# Patient Record
Sex: Female | Born: 1960 | Hispanic: No | Marital: Married | Smoking: Never smoker
Health system: Southern US, Community
[De-identification: ages and names within clinical notes are randomized; demographics above are authoritative.]

## PROBLEM LIST (undated history)

## (undated) DIAGNOSIS — F329 Major depressive disorder, single episode, unspecified: Secondary | ICD-10-CM

## (undated) DIAGNOSIS — K219 Gastro-esophageal reflux disease without esophagitis: Secondary | ICD-10-CM

## (undated) DIAGNOSIS — T7840XA Allergy, unspecified, initial encounter: Secondary | ICD-10-CM

## (undated) DIAGNOSIS — E7800 Pure hypercholesterolemia, unspecified: Secondary | ICD-10-CM

## (undated) DIAGNOSIS — F32A Depression, unspecified: Secondary | ICD-10-CM

## (undated) HISTORY — DX: Depression, unspecified: F32.A

## (undated) HISTORY — DX: Pure hypercholesterolemia, unspecified: E78.00

## (undated) HISTORY — DX: Major depressive disorder, single episode, unspecified: F32.9

## (undated) HISTORY — DX: Allergy, unspecified, initial encounter: T78.40XA

---

## 2008-02-13 HISTORY — PX: CHOLECYSTECTOMY: SHX55

## 2012-02-13 DIAGNOSIS — R42 Dizziness and giddiness: Secondary | ICD-10-CM

## 2012-02-13 HISTORY — DX: Dizziness and giddiness: R42

## 2013-12-03 DIAGNOSIS — Z0000 Encounter for general adult medical examination without abnormal findings: Secondary | ICD-10-CM | POA: Insufficient documentation

## 2013-12-16 DIAGNOSIS — E559 Vitamin D deficiency, unspecified: Secondary | ICD-10-CM | POA: Insufficient documentation

## 2013-12-29 ENCOUNTER — Ambulatory Visit: Payer: 59

## 2013-12-30 ENCOUNTER — Ambulatory Visit (INDEPENDENT_AMBULATORY_CARE_PROVIDER_SITE_OTHER): Payer: 59

## 2013-12-30 VITALS — BP 127/80 | HR 92 | Resp 12

## 2013-12-30 DIAGNOSIS — M2011 Hallux valgus (acquired), right foot: Secondary | ICD-10-CM

## 2013-12-30 DIAGNOSIS — M779 Enthesopathy, unspecified: Secondary | ICD-10-CM

## 2013-12-30 DIAGNOSIS — M7751 Other enthesopathy of right foot: Secondary | ICD-10-CM

## 2013-12-30 DIAGNOSIS — M79671 Pain in right foot: Secondary | ICD-10-CM

## 2013-12-30 DIAGNOSIS — M778 Other enthesopathies, not elsewhere classified: Secondary | ICD-10-CM

## 2013-12-30 NOTE — Patient Instructions (Signed)
Pre-Operative Instructions  Congratulations, you have decided to take an important step to improving your quality of life.  You can be assured that the doctors of [facility] will be with you every step of the way.  1. Plan to be at the surgery center/hospital at least 1 (one) hour prior to your scheduled time unless otherwise directed by the surgical center/hospital staff.  You must have a responsible adult accompany you, remain during the surgery and drive you home.  Make sure you have directions to the surgical center/hospital and know how to get there on time. 2. For hospital based surgery you will need to obtain a history and physical form from your family physician within 1 month prior to the date of surgery- we will give you a form for you primary physician.  3. We make every effort to accommodate the date you request for surgery.  There are however, times where surgery dates or times have to be moved.  We will contact you as soon as possible if a change in schedule is required.   4. No Aspirin/Ibuprofen for one week before surgery.  If you are on aspirin, any non-steroidal anti-inflammatory medications (Mobic, Aleve, Ibuprofen) you should stop taking it 7 days prior to your surgery.  You make take Tylenol  For pain prior to surgery.  5. Medications- If you are taking daily heart and blood pressure medications, seizure, reflux, allergy, asthma, anxiety, pain or diabetes medications, make sure the surgery center/hospital is aware before the day of surgery so they may notify you which medications to take or avoid the day of surgery. 6. No food or drink after midnight the night before surgery unless directed otherwise by surgical center/hospital staff. 7. No alcoholic beverages 24 hours prior to surgery.  No smoking 24 hours prior to or 24 hours after surgery. 8. Wear loose pants or shorts- loose enough to fit over bandages, boots, and casts. 9. No slip on shoes, sneakers are best. 10. Bring  your boot with you to the surgery center/hospital.  Also bring crutches or a walker if your physician has prescribed it for you.  If you do not have this equipment, it will be provided for you after surgery. 11. If you have not been contracted by the surgery center/hospital by the day before your surgery, call to confirm the date and time of your surgery. 12. Leave-time from work may vary depending on the type of surgery you have.  Appropriate arrangements should be made prior to surgery with your employer. 13. Prescriptions will be provided immediately following surgery by your doctor.  Have these filled as soon as possible after surgery and take the medication as directed. 14. Remove nail polish on the operative foot. 15. Wash the night before surgery.  The night before surgery wash the foot and leg well with the antibacterial soap provided and water paying special attention to beneath the toenails and in between the toes.  Rinse thoroughly with water and dry well with a towel.  Perform this wash unless told not to do so by your physician.  Enclosed: 1 Ice pack (please put in freezer the night before surgery)   1 Hibiclens skin cleaner   Pre-op Instructions  If you have any questions regarding the instructions, do not hesitate to call our office.  [place]: [address] [place], [place] [address] [phone]  [place]: [address] , [place] [address] [phone]  [place]: [address]  [place], [place] [address] [phone]  Dr. Devon Pretty   Tuchman DPM, Dr. Norman Regal DPM Dr. Derenda Giddings DPM, Dr. M. Todd Hyatt DPM, Dr. Kathryn Egerton DPM 

## 2013-12-30 NOTE — Progress Notes (Signed)
   Subjective:    Patient ID: Tonette BihariSharise L Feldstein, female    DOB: 11-17-1960, 53 y.o.   MRN: [id number]  HPI  PT STATED RT FOOT BUNION BEEN PAINFUL FOR 8 MONTHS. FOOT IS GETTING WORSE AND GET AGGRAVATED BY WEARING CERTAIN SHOES. TRIED NO TREATMENT.  Review of Systems  All other systems reviewed and are negative.      Objective:   Physical Exam 53 year old white female well-developed well-nourished oriented 3 presents at this time on referral from Dr. Elijah Birkom for painful bunion deformity right great toe joint. Her left foot is also having some issues although not as severe as is being managed by Dr. Elijah Birkom . Currently getting steroid injections also has had plantar fascial symptoms treated with a cam boot on her left foot recently. Lower extremity objective findings as follows vascular status is intact with pedal pulses palpable DP +2 over 4 bilateral PT +2 over 4 bilateral capillary refill time 3 seconds all digits. Epicritic and proprioceptive sensations intact and symmetric bilateral there is normal plantar response DTRs not elicited dermatologically skin color pigment normal hair growth absent nails unremarkable normal orthopedic biomechanical exam reveals notable bunion deformity bilateral lateral deviation of both hallux right more significant left with erythema and edema the first MTP area right with associated capsulitis. X-rays on the right foot demonstrate IM angle greater than 12-13 hallux abductus angle greater than 30 35 sesamoid position there is slight adductovarus rotation lesser digits although they're flexible nonrigid. No cyst or tumors or fractures are otherwise identified rear foot is relatively rectus intact posterior middle facet and subtalar joint cyma line unremarkable. Inferior calcaneal spurring no retrocalcaneal spurring noted. Patient has tried changing shoes pads cushions anti-inflammatories all with little or temporary relief at best and understands that surgery is likely her  best option for correcting bunion at this time in alleviating her symptoms job requires her standing or walking for 8 hour shift as such patient surgery will require her to be out of work for a period of 6 up to 8 weeks before she can return to her work duties and regular activities.       Assessment & Plan:  Assessment hallux abductovalgus deformity right foot action bunion deformities noted bilateral however more severe capsulitis and some small G noted on the right plan at this time x-rays were taken [facility] K wire fixations reviewed and signed all questions asked by the patient are answered we discussed surgical alternatives risks complications reactions to medications etc. She understands she will be in air fracture boot for at least 1 month proxy 5 week duration postoperatively although weightbearing limited activities for the first month. At this time consent form was reviewed and signed and surgery scheduled at her convenience. Patient will continue with care for her contralateral left foot with Dr. Elijah Birkom , however also briefly discussed that the bunion on the left foot may need to be addressed at some point in the future as well however patient was advised that only one to be done at any one time due to the immobilization required.  Alvan Dameichard Thea Holshouser DPM

## 2014-01-22 ENCOUNTER — Telehealth: Payer: Self-pay | Admitting: *Deleted

## 2014-01-22 NOTE — Telephone Encounter (Signed)
"  I need to reschedule my surgery from 02/03/2014 to 02/08/2014."  I informed her that Dr. Ralene CorkSikora doesn't do surgery on Monday.  He works in our [place] office on Monday.  "I will be willing to go there to have it done or do you have any other days available that week?"  I offered her Wednesday, 02/10/2014.  "That is fine, thank you."  I will inform Dr. Ralene CorkSikora.  I called and rescheduled surgery with Aram [facility]amynthia at [facility].

## 2014-01-22 NOTE — Telephone Encounter (Signed)
New post-op appointment is scheduled on Tuesday February 16, 2014 @ 3:15 pm.

## 2014-01-26 ENCOUNTER — Ambulatory Visit (INDEPENDENT_AMBULATORY_CARE_PROVIDER_SITE_OTHER): Payer: 59

## 2014-01-26 ENCOUNTER — Ambulatory Visit: Payer: 59

## 2014-01-26 VITALS — BP 134/82 | HR 99 | Resp 18

## 2014-01-26 DIAGNOSIS — M7732 Calcaneal spur, left foot: Secondary | ICD-10-CM

## 2014-01-26 DIAGNOSIS — M722 Plantar fascial fibromatosis: Secondary | ICD-10-CM

## 2014-01-26 DIAGNOSIS — M79672 Pain in left foot: Secondary | ICD-10-CM

## 2014-01-26 NOTE — Progress Notes (Signed)
   Subjective:    Patient ID: Christy Watson, female    DOB: May 09, 1960, 53 y.o.   MRN: [id number]  HPI I AM HERE TO TALK ABOUT HAVING SURGERY ON MY LEFT FOOT    Review of Systems no new changes or findings, however patient pain is more significant on her left at this time rather than her right bunion and patient is electing to have surgery on her left foot this year rather than the right     Objective:   Physical Exam 53 year old female well-developed well-nourished oriented 3 presents at this time for follow-up and surgical consultation for left foot. Patient had previously been consented to surgery for her bunion on right foot however wishes to delay that as it is not as painful or symptomatic is her current condition Hunter fasciitis left heel patient is been through steroid injections anti-inflammatories stretching ice exercises steroids and anti-inflammatory medication nonsteroidal anti-inflammatory medications all of which provided little or temporary relief at best. Continues to do stretching ice and exercise however on palpation his exquisite pain tenderness in the inferior calcaneal tubercle of the left heel. There is a small inferior calcaneal spur no fractures no cyst or tumors noted significant inflammation of the fascia is identified on x-ray.       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 Patient or stand  she'll also need to maintain orthoses afterwards and continue with preventative measures as a recurrence is always a possibility.  Alvan Dameichard Antwann Preziosi DPM

## 2014-01-26 NOTE — Patient Instructions (Signed)
Pre-Operative Instructions  Congratulations, you have decided to take an important step to improving your quality of life.  You can be assured that the doctors of [facility] will be with you every step of the way.  1. Plan to be at the surgery center/hospital at least 1 (one) hour prior to your scheduled time unless otherwise directed by the surgical center/hospital staff.  You must have a responsible adult accompany you, remain during the surgery and drive you home.  Make sure you have directions to the surgical center/hospital and know how to get there on time. 2. For hospital based surgery you will need to obtain a history and physical form from your family physician within 1 month prior to the date of surgery- we will give you a form for you primary physician.  3. We make every effort to accommodate the date you request for surgery.  There are however, times where surgery dates or times have to be moved.  We will contact you as soon as possible if a change in schedule is required.   4. No Aspirin/Ibuprofen for one week before surgery.  If you are on aspirin, any non-steroidal anti-inflammatory medications (Mobic, Aleve, Ibuprofen) you should stop taking it 7 days prior to your surgery.  You make take Tylenol  For pain prior to surgery.  5. Medications- If you are taking daily heart and blood pressure medications, seizure, reflux, allergy, asthma, anxiety, pain or diabetes medications, make sure the surgery center/hospital is aware before the day of surgery so they may notify you which medications to take or avoid the day of surgery. 6. No food or drink after midnight the night before surgery unless directed otherwise by surgical center/hospital staff. 7. No alcoholic beverages 24 hours prior to surgery.  No smoking 24 hours prior to or 24 hours after surgery. 8. Wear loose pants or shorts- loose enough to fit over bandages, boots, and casts. 9. No slip on shoes, sneakers are best. 10. Bring  your boot with you to the surgery center/hospital.  Also bring crutches or a walker if your physician has prescribed it for you.  If you do not have this equipment, it will be provided for you after surgery. 11. If you have not been contracted by the surgery center/hospital by the day before your surgery, call to confirm the date and time of your surgery. 12. Leave-time from work may vary depending on the type of surgery you have.  Appropriate arrangements should be made prior to surgery with your employer. 13. Prescriptions will be provided immediately following surgery by your doctor.  Have these filled as soon as possible after surgery and take the medication as directed. 14. Remove nail polish on the operative foot. 15. Wash the night before surgery.  The night before surgery wash the foot and leg well with the antibacterial soap provided and water paying special attention to beneath the toenails and in between the toes.  Rinse thoroughly with water and dry well with a towel.  Perform this wash unless told not to do so by your physician.  Enclosed: 1 Ice pack (please put in freezer the night before surgery)   1 Hibiclens skin cleaner   Pre-op Instructions  If you have any questions regarding the instructions, do not hesitate to call our office.  [place]: [address] [place], [place] [address] [phone]  [place]: [address] [place], [place] [address] [phone]  [place]: [address]  [place], [place] [address] [phone]  Dr. Makeda Peeks   Tuchman DPM, Dr. Norman Regal DPM Dr. Rehmat Murtagh DPM, Dr. M. Todd Hyatt DPM, Dr. Kathryn Egerton DPM 

## 2014-02-08 ENCOUNTER — Telehealth: Payer: Self-pay | Admitting: *Deleted

## 2014-02-08 NOTE — Telephone Encounter (Signed)
Calling to verify some medical information for her disability claim and to follow-up to see if you received any disability paperwork from [employer].  If you received the forms, we'd like to know the turn around time.  We'd greatly appreciate it if you would call us back.

## 2014-02-10 DIAGNOSIS — M722 Plantar fascial fibromatosis: Secondary | ICD-10-CM

## 2014-02-16 ENCOUNTER — Ambulatory Visit (INDEPENDENT_AMBULATORY_CARE_PROVIDER_SITE_OTHER): Payer: 59

## 2014-02-16 ENCOUNTER — Ambulatory Visit: Payer: Self-pay

## 2014-02-16 VITALS — BP 111/78 | HR 96 | Resp 12

## 2014-02-16 DIAGNOSIS — Z09 Encounter for follow-up examination after completed treatment for conditions other than malignant neoplasm: Secondary | ICD-10-CM

## 2014-02-16 DIAGNOSIS — M722 Plantar fascial fibromatosis: Secondary | ICD-10-CM

## 2014-02-16 DIAGNOSIS — M2011 Hallux valgus (acquired), right foot: Secondary | ICD-10-CM

## 2014-02-16 NOTE — Progress Notes (Signed)
   Subjective:    Patient ID: Tonette BihariSharise L Gobert, female    DOB: 27-Nov-1960, 54 y.o.   MRN: [id number]  HPI  DOS 02/10/14 EPF.  ''LF FOOT HEEL IS DOING OK BUT STILL HAVE NUMBNESS ON THE TOES.''  Review of Systems no new findings or systemic changes noted     Objective:   Physical Exam Neurovascular status is intact pedal pulses are palpable DP and PT +2 over 4 Refill time 3 seconds patient is having some abnormal sensation to the dorsal plantar digits and forefoot on the left foot although has intact motor function dorsal flexion plantar flexion inversion eversion good grip the toes has some abnormal sensation may be due to postoperative dressing and edema have minimal or no reproducible pain on palpation medial band plantar fascia medial trochanter tubercle incisions well coapted suture tacks removed and compression stockings applied may resume normal bathing and hygiene starting on Saturday this week however will maintain air fracture boot at all times for at least 2 more weeks as instructed.       Assessment & Plan:  Assessment good postop progress may have some mild neuritis or neuralgia effecting the lesser digits or central digits 23 and 4 although on intact function is noted no motor deficits foot otherwise unremarkable mild edema in the incision areas noted consistent with post EPF procedure. Recheck in 2 weeks for follow-up maintain air fracture boot may resume bathing and hygiene starting on Saturday this week.  Alvan Dameichard Alonie Gazzola DPM

## 2014-02-16 NOTE — Patient Instructions (Signed)
ICE INSTRUCTIONS  Apply ice or cold pack to the affected area at least 3 times a day for 10-15 minutes each time.  You should also use ice after prolonged activity or vigorous exercise.  Do not apply ice longer than 20 minutes at one time.  Always keep a cloth between your skin and the ice pack to prevent burns.  Being consistent and following these instructions will help control your symptoms.  We suggest you purchase a gel ice pack because they are reusable and do bit leak.  Some of them are designed to wrap around the area.  Use the method that works best for you.  Here are some other suggestions for icing.   Use a frozen bag of peas or corn-inexpensive and molds well to your body, usually stays frozen for 10 to 20 minutes.  Wet a towel with cold water and squeeze out the excess until it's damp.  Place in a bag in the freezer for 20 minutes. Then remove and use.  Maintain bandage and anklet to this weekend starting on Saturday may resume normal bathing and hygiene and maintain Neosporin and Band-Aid to the incision if needed maintain anklet for compression as well as maintaining the boot for the next 2 more weeks.

## 2014-03-02 ENCOUNTER — Ambulatory Visit (INDEPENDENT_AMBULATORY_CARE_PROVIDER_SITE_OTHER): Payer: 59

## 2014-03-02 VITALS — BP 125/94 | HR 86 | Resp 12

## 2014-03-02 DIAGNOSIS — M722 Plantar fascial fibromatosis: Secondary | ICD-10-CM

## 2014-03-02 DIAGNOSIS — M79672 Pain in left foot: Secondary | ICD-10-CM

## 2014-03-02 DIAGNOSIS — Z09 Encounter for follow-up examination after completed treatment for conditions other than malignant neoplasm: Secondary | ICD-10-CM

## 2014-03-02 NOTE — Patient Instructions (Signed)
ICE INSTRUCTIONS  Apply ice or cold pack to the affected area at least 3 times a day for 10-15 minutes each time.  You should also use ice after prolonged activity or vigorous exercise.  Do not apply ice longer than 20 minutes at one time.  Always keep a cloth between your skin and the ice pack to prevent burns.  Being consistent and following these instructions will help control your symptoms.  We suggest you purchase a gel ice pack because they are reusable and do bit leak.  Some of them are designed to wrap around the area.  Use the method that works best for you.  Here are some other suggestions for icing.   Use a frozen bag of peas or corn-inexpensive and molds well to your body, usually stays frozen for 10 to 20 minutes.  Wet a towel with cold water and squeeze out the excess until it's damp.  Place in a bag in the freezer for 20 minutes. Then remove and use.  Use ice every evening especially after being on her feet there is any aching throbbing or swelling apply ice as needed.

## 2014-03-02 NOTE — Progress Notes (Signed)
   Subjective:    Patient ID: Christy Watson, female    DOB: 04-Feb-1961, 54 y.o.   MRN: [id number]  HPI  DOS 02/10/14 EPF LT FOOT. ''LT FOOT BOTTOM HEEL IS DOING MUCH BETTER BUT BACK SIDE OF THE HEEL IS PAINFUL.''  Review of Systems No new findings or systemic changes noted    Objective:   Physical Exam Neurovascular status is intact pedal pulses are palpable epicritic and proprioceptive sensations intact minimal or no inferior pain slight tenderness in the posterior calcaneal area may be compensatory pain in the Achilles tendon at this time she proxy 3 weeks postop doing well is well coapted no dehiscence no discharge no signs of infection.       Assessment & Plan:  Assessment is good postop progress fungal EPF left foot. Plan at this time patient may resume regular shoes and orthoses discontinue air fracture boot at this time. May return to work activities to restricted duties 40 hours max may stand or walk 15-20 minutes per hour otherwise sitting and desk job recommendations for the next 6 weeks. After 6 week should be worked regular duties. Return in 4 weeks for long-term postop follow-up the interim maintain compression stocking and ice to help keep down swelling and inflammation.  Alvan Dameichard Javin Nong DPM

## 2014-03-30 ENCOUNTER — Ambulatory Visit (INDEPENDENT_AMBULATORY_CARE_PROVIDER_SITE_OTHER): Payer: 59

## 2014-03-30 VITALS — BP 114/96 | HR 100 | Resp 12

## 2014-03-30 DIAGNOSIS — M2011 Hallux valgus (acquired), right foot: Secondary | ICD-10-CM

## 2014-03-30 DIAGNOSIS — Z09 Encounter for follow-up examination after completed treatment for conditions other than malignant neoplasm: Secondary | ICD-10-CM

## 2014-03-30 DIAGNOSIS — M722 Plantar fascial fibromatosis: Secondary | ICD-10-CM

## 2014-03-30 DIAGNOSIS — M79672 Pain in left foot: Secondary | ICD-10-CM

## 2014-03-30 NOTE — Patient Instructions (Signed)
ICE INSTRUCTIONS  Apply ice or cold pack to the affected area at least 3 times a day for 10-15 minutes each time.  You should also use ice after prolonged activity or vigorous exercise.  Do not apply ice longer than 20 minutes at one time.  Always keep a cloth between your skin and the ice pack to prevent burns.  Being consistent and following these instructions will help control your symptoms.  We suggest you purchase a gel ice pack because they are reusable and do bit leak.  Some of them are designed to wrap around the area.  Use the method that works best for you.  Here are some other suggestions for icing.   Use a frozen bag of peas or corn-inexpensive and molds well to your body, usually stays frozen for 10 to 20 minutes.  Wet a towel with cold water and squeeze out the excess until it's damp.  Place in a bag in the freezer for 20 minutes. Then remove and use.  Steadily increase activities and walking levels over the next several months.

## 2014-03-30 NOTE — Progress Notes (Signed)
   Subjective:    Patient ID: Christy BihariSharise L Watson, female    DOB: Apr 24, 1960, 54 y.o.   MRN: [id number]  HPI  DOS 02/10/14 EPF LT FOOT.  ''LT FOOT BOTTOM OF THE HEEL STILL PAINFUL BUT BACK OF THE HEEL IS MUCH BETTER.''  Review of Systems no new findings or systemic changes noted    Objective:   Physical Exam  Patient is about a month and a half postop EPF left foot still some slight tenderness although not as bad is greatly improved no retrocalcaneal pain mild inferior calcaneal pain occasionally some twinging or shooting pain over the dorsum of the foot particular the base of the first metatarsal possibly some irritation to the dorsal nerve. Maintain compression stocking during the day maintaining good stable shoe did make recommendation is for new balance or Shon BatonBrooks a 6 or [place]'s or as well as [employer]  comfort or Birkenstock type shoes. No pain incisions well coapted ecchymosis no edema no discharge or drainage    Assessment & Plan:  Assessment good postop progress for EPF left foot reappointed 2 months for long-term follow-up possible consult for the contralateral right foot bunion at that time. Within 2 weeks should be able return to work activities without major restrictions  Alvan Dameichard Amiliana Foutz DPM

## 2014-06-01 ENCOUNTER — Ambulatory Visit: Payer: 59

## 2014-08-31 DIAGNOSIS — F419 Anxiety disorder, unspecified: Secondary | ICD-10-CM | POA: Insufficient documentation

## 2015-11-21 ENCOUNTER — Encounter: Payer: Self-pay | Admitting: Nurse Practitioner

## 2016-05-30 ENCOUNTER — Encounter: Payer: Self-pay | Admitting: Nurse Practitioner

## 2016-09-03 ENCOUNTER — Encounter: Payer: Self-pay | Admitting: Nurse Practitioner

## 2016-11-16 ENCOUNTER — Encounter: Payer: Self-pay | Admitting: Nurse Practitioner

## 2017-07-15 ENCOUNTER — Other Ambulatory Visit: Payer: Self-pay

## 2017-07-15 ENCOUNTER — Encounter: Payer: Self-pay | Admitting: Obstetrics and Gynecology

## 2017-07-15 ENCOUNTER — Ambulatory Visit (INDEPENDENT_AMBULATORY_CARE_PROVIDER_SITE_OTHER): Payer: 59 | Admitting: Obstetrics and Gynecology

## 2017-07-15 VITALS — BP 120/78 | HR 100 | Ht 66.0 in | Wt 210.0 lb

## 2017-07-15 DIAGNOSIS — Z1339 Encounter for screening examination for other mental health and behavioral disorders: Secondary | ICD-10-CM

## 2017-07-15 DIAGNOSIS — Z1211 Encounter for screening for malignant neoplasm of colon: Secondary | ICD-10-CM

## 2017-07-15 DIAGNOSIS — Z01419 Encounter for gynecological examination (general) (routine) without abnormal findings: Secondary | ICD-10-CM | POA: Diagnosis not present

## 2017-07-15 DIAGNOSIS — Z124 Encounter for screening for malignant neoplasm of cervix: Secondary | ICD-10-CM | POA: Diagnosis not present

## 2017-07-15 DIAGNOSIS — E782 Mixed hyperlipidemia: Secondary | ICD-10-CM | POA: Insufficient documentation

## 2017-07-15 DIAGNOSIS — F320 Major depressive disorder, single episode, mild: Secondary | ICD-10-CM | POA: Insufficient documentation

## 2017-07-15 DIAGNOSIS — I10 Essential (primary) hypertension: Secondary | ICD-10-CM | POA: Insufficient documentation

## 2017-07-15 DIAGNOSIS — Z1331 Encounter for screening for depression: Secondary | ICD-10-CM | POA: Diagnosis not present

## 2017-07-15 NOTE — Patient Instructions (Signed)
For mammogram: [email or web address]  Phone number: Main: [phone]

## 2017-07-15 NOTE — Progress Notes (Signed)
Routine Annual Gynecology Examination   PCP: Patient, No Pcp Per  Chief Complaint  Patient presents with  . Gynecologic Exam    No complaints   History of Present Illness: Patient is a 57 y.o. G2P0011 presents for annual exam. The patient has no complaints today.   Menopausal bleeding: denies  Menopausal symptoms: reports hot flashes, difficulty sleeping, tired a lot.  Breast symptoms: denies, last mammogram was 2 years ago and it was normal.   Last pap smear: 2 years ago.  Result Normal  Last mammogram: 2 years ago.  Result Normal   She has a vulvar cyst.  It has been present for a couple of weeks on her right side. It is not painful.  She has had a couple that have came and went. She is sexually active. It does not bother her during intercourse.    She has had a cough that started last week.  The cough has been productive of green/yellow sputum.  No fevers and chills.  She denies chest pain and trouble breathing.  She is surrounded by other people with illness.    Past Medical History:  Diagnosis Date  . Allergy   . Depression   . Hypercholesterolemia     Past Surgical History:  Procedure Laterality Date  . CHOLECYSTECTOMY  2010    Medications   Medication Sig Start Date End Date Taking? Authorizing Provider  azelastine (ASTELIN) 0.1 % nasal spray Place into both nostrils 2 (two) times daily. Use in each nostril as directed   Yes [provider]  simvastatin (ZOCOR) 20 MG tablet Take 20 mg by mouth daily.   Yes [provider]  venlafaxine XR (EFFEXOR-XR) 150 MG 24 hr capsule  12/29/13  Yes [provider]    Allergies  Allergen Reactions  . Sulfa Antibiotics Hives   Obstetric History: G2P1011, SVD 3, female   Social History   Socioeconomic History  . Marital status: Unknown    Spouse name: Not on file  . Number of children: Not on file  . Years of education: Not on file  . Highest education level: Not on file    Occupational History  . Not on file  Social Needs  . Financial resource strain: Not on file  . Food insecurity:    Worry: Not on file    Inability: Not on file  . Transportation needs:    Medical: Not on file    Non-medical: Not on file  Tobacco Use  . Smoking status: Never Smoker  . Smokeless tobacco: Never Used  Substance and Sexual Activity  . Alcohol use: No    Alcohol/week: 0.0 oz  . Drug use: No  . Sexual activity: Yes    Birth control/protection: None  Lifestyle  . Physical activity:    Days per week: 0 days    Minutes per session: Not on file  . Stress: Not on file  Relationships  . Social connections:    Talks on phone: Not on file    Gets together: Not on file    Attends religious service: Not on file    Active member of club or organization: Not on file    Attends meetings of clubs or organizations: Not on file    Relationship status: Not on file  . Intimate partner violence:    Fear of current or ex partner: Not on file    Emotionally abused: Not on file    Physically abused: Not on file  Forced sexual activity: Not on file  Other Topics Concern  . Not on file  Social History Narrative  . Not on file    Family History  Problem Relation Age of Onset  . Autoimmune disease Mother   . Diabetes Father   . Thyroid disease Sister   . Autoimmune disease Sister   . Hyperlipidemia Brother   . Hypertension Brother   . Thyroid disease Maternal Aunt   . Bone cancer Maternal Grandfather   . Breast cancer Paternal Grandmother [phone]  . Brain cancer Paternal Grandfather [phone]    Review of Systems  Constitutional: Positive for malaise/fatigue. Negative for chills, diaphoresis, fever and weight loss.       Hot flashes  HENT: Positive for sore throat. Negative for congestion, ear discharge, ear pain, hearing loss, nosebleeds, sinus pain and tinnitus.   Eyes: Positive for redness. Negative for blurred vision, double vision, photophobia, pain and discharge.   Respiratory: Positive for cough. Negative for hemoptysis, sputum production, shortness of breath, wheezing and stridor.   Cardiovascular: Negative.   Gastrointestinal: Positive for diarrhea. Negative for abdominal pain, blood in stool, constipation, heartburn, melena, nausea and vomiting.  Genitourinary: Negative.        Difficulty holding urine  Musculoskeletal: Negative.   Skin: Positive for itching. Negative for rash.       Breast tenderness  Neurological: Negative.   Endo/Heme/Allergies: Negative for environmental allergies and polydipsia. Bruises/bleeds easily.  Psychiatric/Behavioral: Positive for depression. Negative for hallucinations, memory loss, substance abuse and suicidal ideas. The patient is not nervous/anxious and does not have insomnia.      Physical Exam Vitals: BP 120/78 (BP Location: Left Arm, Patient Position: Sitting, Cuff Size: Normal)   Pulse 100   Ht 5\' 6"  (1.676 m)   Wt 210 lb (95.3 kg)   BMI 33.89 kg/m   Physical Exam  Constitutional: She is oriented to person, place, and time. She appears well-developed and well-nourished. No distress.  Genitourinary: Uterus normal. Pelvic exam was performed with patient supine. There is no rash, tenderness, lesion or injury on the right labia. There is no rash, tenderness, lesion or injury on the left labia.    No erythema, tenderness or bleeding in the vagina. No signs of injury around the vagina. No vaginal discharge found. Right adnexum does not display mass, does not display tenderness and does not display fullness. Left adnexum does not display mass, does not display tenderness and does not display fullness. Cervix does not exhibit motion tenderness, lesion, discharge or polyp.   Uterus is mobile and anteverted. Uterus is not enlarged, tender or exhibiting a mass.  HENT:  Head: Normocephalic and atraumatic.  Eyes: EOM are normal. No scleral icterus.  Neck: Normal range of motion. Neck supple. No thyromegaly present.   Cardiovascular: Normal rate and regular rhythm. Exam reveals no gallop and no friction rub.  No murmur heard. Pulmonary/Chest: Effort normal and breath sounds normal. No respiratory distress. She has no wheezes. She has no rales. Right breast exhibits no inverted nipple, no mass, no nipple discharge, no skin change and no tenderness. Left breast exhibits no inverted nipple, no mass, no nipple discharge, no skin change and no tenderness.  Abdominal: Soft. Bowel sounds are normal. She exhibits no distension and no mass. There is no tenderness. There is no rebound and no guarding.  Musculoskeletal: Normal range of motion. She exhibits no edema or tenderness.  Lymphadenopathy:    She has no cervical adenopathy.       Right:  No inguinal adenopathy present.       Left: No inguinal adenopathy present.  Neurological: She is alert and oriented to person, place, and time. No cranial nerve deficit.  Skin: Skin is warm and dry. No rash noted. No erythema.  Psychiatric: She has a normal mood and affect. Her behavior is normal. Judgment normal.    Female chaperone present for pelvic and breast  portions of the physical exam  Results: AUDIT Questionnaire (screen for alcoholism): 1 PHQ-9: 5   Assessment and Plan:  57 y.o. G45P0011 female here for routine annual gynecologic examination  Plan: Problem List Items Addressed This Visit    None    Visit Diagnoses    Women's annual routine gynecological examination    -  Primary   Relevant Orders   IGP, Aptima HPV, rfx 16/18,45   Ambulatory referral to Gastroenterology   Screening for depression       Screening for alcoholism       Pap smear for cervical cancer screening       Relevant Orders   IGP, Aptima HPV, rfx 16/18,45   Screen for colon cancer       Relevant Orders   Ambulatory referral to Gastroenterology      Screening: -- Blood pressure screen normal -- Colonoscopy - due - will schedule -- Mammogram - not due -- Weight screening:  obese: discussed management options, including lifestyle, dietary, and exercise. -- Depression screening negative (PHQ-9) -- Nutrition: normal -- cholesterol screening: per PCP -- osteoporosis screening: not due -- tobacco screening: not using -- alcohol screening: AUDIT questionnaire indicates low-risk usage. -- family history of breast cancer screening: done. not at high risk. -- no evidence of domestic violence or intimate partner violence. -- STD screening: gonorrhea/chlamydia NAAT not collected per patient request. -- pap smear collected per ASCCP guidelines -- HPV vaccination series: not eligilbe  Thomasene Mohair, MD 07/15/2017 1:47 PM

## 2017-07-17 ENCOUNTER — Other Ambulatory Visit: Payer: Self-pay

## 2017-07-18 LAB — IGP, APTIMA HPV, RFX 16/18,45
HPV APTIMA: NEGATIVE
PAP Smear Comment: 0

## 2017-07-25 ENCOUNTER — Encounter: Payer: Self-pay | Admitting: Obstetrics and Gynecology

## 2017-08-13 ENCOUNTER — Ambulatory Visit (INDEPENDENT_AMBULATORY_CARE_PROVIDER_SITE_OTHER): Payer: 59 | Admitting: Nurse Practitioner

## 2017-08-13 ENCOUNTER — Encounter: Payer: Self-pay | Admitting: Nurse Practitioner

## 2017-08-13 VITALS — BP 139/75 | HR 90 | Resp 16 | Ht 66.0 in | Wt 208.6 lb

## 2017-08-13 DIAGNOSIS — J309 Allergic rhinitis, unspecified: Secondary | ICD-10-CM | POA: Insufficient documentation

## 2017-08-13 DIAGNOSIS — F419 Anxiety disorder, unspecified: Secondary | ICD-10-CM | POA: Diagnosis not present

## 2017-08-13 DIAGNOSIS — R197 Diarrhea, unspecified: Secondary | ICD-10-CM

## 2017-08-13 DIAGNOSIS — Z7689 Persons encountering health services in other specified circumstances: Secondary | ICD-10-CM | POA: Diagnosis not present

## 2017-08-13 DIAGNOSIS — I10 Essential (primary) hypertension: Secondary | ICD-10-CM

## 2017-08-13 DIAGNOSIS — E785 Hyperlipidemia, unspecified: Secondary | ICD-10-CM

## 2017-08-13 DIAGNOSIS — F320 Major depressive disorder, single episode, mild: Secondary | ICD-10-CM

## 2017-08-13 DIAGNOSIS — J301 Allergic rhinitis due to pollen: Secondary | ICD-10-CM | POA: Diagnosis not present

## 2017-08-13 MED ORDER — DICYCLOMINE HCL 10 MG PO CAPS: 10.0000 mg | ORAL_CAPSULE | Freq: Three times a day (TID) | ORAL | 5 refills | Status: DC

## 2017-08-13 MED ORDER — VENLAFAXINE HCL ER 75 MG PO CP24: 75.0000 mg | ORAL_CAPSULE | Freq: Every day | ORAL | 5 refills | Status: DC

## 2017-08-13 MED ORDER — AZELASTINE HCL 0.1 % NA SOLN: 1.0000 | Freq: Two times a day (BID) | NASAL | 5 refills | Status: DC

## 2017-08-13 MED ORDER — SIMVASTATIN 20 MG PO TABS: 20.0000 mg | ORAL_TABLET | Freq: Every day | ORAL | 5 refills | Status: DC

## 2017-08-13 NOTE — Patient Instructions (Addendum)
Christy BihariSharise L Watson,   Thank you for coming in to clinic today.  1. For diarrhea: - Exclude all dairy for 2 weeks.  You could have lactose intolerance. - Then, you could consider gluten exclusion.   - START 1/4 dose metamucil once daily.  Then up to 1/2 dose once daily. - START dicyclomine 10 mg up to three times daily with meals.  2. Reduce venlafaxine to 75 mg once daily  3. Take simvastatin in morning if needed.  4. You will be due for FASTING BLOOD WORK.  This means you should eat no food or drink after midnight.  Drink only water or coffee without cream/sugar on the morning of your lab visit. - Please go ahead and schedule a "Lab Only" visit in the morning at the clinic for lab draw 3 days prior to your next visit. - Your results will be available about 2-3 days after blood draw.  If you have set up a MyChart account, you can can log in to MyChart online to view your results and a brief explanation. Also, we can discuss your results together at your next office visit if you would like.   Please schedule a follow-up appointment with Wilhelmina McardleLauren Brilee Port, AGNP. Return in about 3 months (around 11/13/2017) for anxiety/depression, diarrhea, cholesterol.  If you have any other questions or concerns, please feel free to call the clinic or send a message through MyChart. You may also schedule an earlier appointment if necessary.  You will receive a survey after today's visit either digitally by e-mail or paper by [employer] mail. Your experiences and feedback matter to us.  Please respond so we know how we are doing as we provide care for you.   Wilhelmina McardleLauren Lyndsey Demos, DNP, AGNP-BC Adult Gerontology Nurse Practitioner [facility]

## 2017-08-13 NOTE — Progress Notes (Signed)
Subjective:    Patient ID: Christy Watson, female    DOB: 12/23/1960, 57 y.o.   MRN: [id number]  Christy Watson is a 57 y.o. female presenting on 08/13/2017 for Establish Care and Depression (refill effexor)   HPI Establish Care New Provider Pt last seen by PCP around January 2019.  Obtain records from [facility] [place], [place].  Depression Patient presents today for continuation of medications for his depression.  She was recently taken off Wellbutrin and put on Effexor.  She notes new side effect of tremor.  Is still having some shaking in left hand.  Notices when holding a cell phone or piece of paper.  Patient is taking venlafaxine 150 mg daily.  Diarrhea Also experiencing diarrhea every day although has had this "for years."  Is getting worse.  Gall bladder removed a long time ago.  Notes more diarrhea with high fat meals, but admits diarrhea occurs even without hypercapnia.  Nocturnal enuresis Nocturnal enuresis new x 1 occurrence 2 months ago. - No alcohol.  Woke up during urination.  Followed by jaw line itching.  Which has now resolved.   Anke pain - bilateral  No new injury.  Is now starting to exercise to loosen them.  Notes no significant problems, but is slightly concerned  Screening for diabetes Patient requests screening for diabetes because she notes that she is very tired with sugar or carbs - Has "crankiness," nausea, shakiness without food (hypoglycemia).  Past Medical History:  Diagnosis Date  . Allergy   . Depression   . Hypercholesterolemia    Past Surgical History:  Procedure Laterality Date  . CHOLECYSTECTOMY  2010   Social History   Socioeconomic History  . Marital status: Unknown    Spouse name: Not on file  . Number of children: Not on file  . Years of education: Not on file  . Highest education level: Not on file  Occupational History  . Not on file  Social Needs  . Financial resource strain: Not on file  . Food  insecurity:    Worry: Not on file    Inability: Not on file  . Transportation needs:    Medical: Not on file    Non-medical: Not on file  Tobacco Use  . Smoking status: Never Smoker  . Smokeless tobacco: Never Used  Substance and Sexual Activity  . Alcohol use: No    Alcohol/week: 0.0 oz  . Drug use: No  . Sexual activity: Yes    Birth control/protection: None  Lifestyle  . Physical activity:    Days per week: 0 days    Minutes per session: Not on file  . Stress: Not on file  Relationships  . Social connections:    Talks on phone: Not on file    Gets together: Not on file    Attends religious service: Not on file    Active member of club or organization: Not on file    Attends meetings of clubs or organizations: Not on file    Relationship status: Not on file  . Intimate partner violence:    Fear of current or ex partner: Not on file    Emotionally abused: Not on file    Physically abused: Not on file    Forced sexual activity: Not on file  Other Topics Concern  . Not on file  Social History Narrative  . Not on file   Family History  Problem Relation Age of Onset  .  Autoimmune disease Mother   . Diabetes Father   . Thyroid disease Sister   . Autoimmune disease Sister   . Hyperlipidemia Brother   . Hypertension Brother   . Thyroid disease Maternal Aunt   . Bone cancer Maternal Grandfather   . Breast cancer Paternal Grandmother [phone]  . Brain cancer Paternal Grandfather [phone]   Current Outpatient Medications on File Prior to Visit  Medication Sig  . azelastine (ASTELIN) 0.1 % nasal spray Place into both nostrils 2 (two) times daily. Use in each nostril as directed  . simvastatin (ZOCOR) 20 MG tablet Take 20 mg by mouth daily.  Marland Kitchen. venlafaxine XR (EFFEXOR-XR) 150 MG 24 hr capsule    No current facility-administered medications on file prior to visit.     Review of Systems  Constitutional: Positive for fatigue. Negative for chills and fever.  HENT: Negative for  congestion and sore throat.   Eyes: Negative for pain.  Respiratory: Negative for cough, shortness of breath and wheezing.   Cardiovascular: Negative for chest pain, palpitations and leg swelling.  Gastrointestinal: Positive for diarrhea. Negative for abdominal pain, blood in stool, constipation, nausea and vomiting.  Endocrine: Negative for polydipsia.  Genitourinary: Positive for difficulty urinating. Negative for dysuria, frequency, hematuria and urgency.  Musculoskeletal: Positive for arthralgias (Bilateral ankles). Negative for back pain, myalgias and neck pain.  Skin: Negative.  Negative for rash.  Allergic/Immunologic: Negative for environmental allergies.  Neurological: Negative for dizziness, weakness and headaches.  Hematological: Does not bruise/bleed easily.  Psychiatric/Behavioral: Positive for dysphoric mood. Negative for suicidal ideas. The patient is not nervous/anxious.    Per HPI unless specifically indicated above     Objective:    BP 139/75   Pulse 90   Resp 16   Ht 5\' 6"  (1.676 m)   Wt 208 lb 9.6 oz (94.6 kg)   BMI 33.67 kg/m   Wt Readings from Last 3 Encounters:  08/13/17 208 lb 9.6 oz (94.6 kg)  07/15/17 210 lb (95.3 kg)    Physical Exam  Constitutional: She is oriented to person, place, and time. She appears well-developed and well-nourished. No distress.  HENT:  Head: Normocephalic and atraumatic.  Cardiovascular: Normal rate, regular rhythm, S1 normal, S2 normal, normal heart sounds and intact distal pulses.  Pulmonary/Chest: Effort normal and breath sounds normal. No respiratory distress.  Abdominal: Soft. She exhibits no distension. Bowel sounds are increased. There is no hepatosplenomegaly. There is no tenderness. No hernia.  Neurological: She is alert and oriented to person, place, and time.  Skin: Skin is warm and dry.  Psychiatric: She has a normal mood and affect. Her behavior is normal.  Vitals reviewed.   Results for orders placed or  performed in visit on 07/15/17  IGP, Aptima HPV, rfx 16/18,45  Result Value Ref Range   DIAGNOSIS: Comment    Specimen adequacy: Comment    Clinician Provided ICD10 Comment    Performed by: Comment    PAP Smear Comment .    Note: Comment    Test Methodology Comment    HPV Aptima Negative Negative      Assessment & Plan:   Problem List Items Addressed This Visit      Cardiovascular and Mediastinum   Benign essential hypertension Stable.  Continue meds without change.  Refills provided.  Labs at next visit.  Follow-up 3 months    Relevant Medications   simvastatin (ZOCOR) 20 MG tablet     Respiratory   Allergic rhinitis Stable.  Continue astelin.  No changes.  May need ENT in future if worsening.  Consider flonase/antihistamine daily for improved control in future if needed.  Follow-up prn.   Relevant Medications   azelastine (ASTELIN) 0.1 % nasal spray     Other   Hyperlipidemia LDL goal <130 Previously stable per patient report.  Continue simvastatin.  Refills provided.  Labs at next visit.  Follow-up 3 months    Relevant Medications   simvastatin (ZOCOR) 20 MG tablet   Mild major depression (HCC) Stable and improving .  Patient can begin to reduce venlafaxine to 75 mg once daily.  Continue non-pharm stress management strategies.  Follow-up 3 months.   Relevant Medications   venlafaxine XR (EFFEXOR-XR) 75 MG 24 hr capsule   Anxiety - Primary See AP depression   Relevant Medications   venlafaxine XR (EFFEXOR-XR) 75 MG 24 hr capsule    Other Visit Diagnoses    Encounter to establish care     Previous PCP Records will be requested.  Past medical, family, and surgical history reviewed w/ pt.     Diarrhea, unspecified type     Uncontrolled and unspecified trigger.  Patient should consider gluten, dairy exclusion may also consider soy/egg exclusion if needing to further id possible triggers.   - START 1/4 dose metamucil once daily.  Then up to 1/2 dose once daily. - START  dicyclomine 10 mg up to three times daily with meals. - Followup 3 months   Relevant Medications   dicyclomine (BENTYL) 10 MG capsule      Meds ordered this encounter  Medications  . simvastatin (ZOCOR) 20 MG tablet    Sig: Take 1 tablet (20 mg total) by mouth daily.    Dispense:  30 tablet    Refill:  5    Order Specific Question:   Supervising Provider    Answer:   Smitty Cords [[id number]]  . venlafaxine XR (EFFEXOR-XR) 75 MG 24 hr capsule    Sig: Take 1 capsule (75 mg total) by mouth daily with breakfast.    Dispense:  30 capsule    Refill:  5    Order Specific Question:   Supervising Provider    Answer:   Smitty Cords [[id number]]  . azelastine (ASTELIN) 0.1 % nasal spray    Sig: Place 1 spray into both nostrils 2 (two) times daily. Use in each nostril as directed    Dispense:  30 mL    Refill:  5    Order Specific Question:   Supervising Provider    Answer:   Smitty Cords [[id number]]  . dicyclomine (BENTYL) 10 MG capsule    Sig: Take 1 capsule (10 mg total) by mouth 4 (four) times daily -  before meals and at bedtime.    Dispense:  90 capsule    Refill:  5    Order Specific Question:   Supervising Provider    Answer:   Smitty Cords [[id number]]     Follow up plan: Return in about 3 months (around 11/13/2017) for anxiety/depression, diarrhea, cholesterol.  Wilhelmina Mcardle, DNP, AGPCNP-BC Adult Gerontology Primary Care Nurse Practitioner [facility] 08/13/2017, 2:58 PM

## 2017-11-11 ENCOUNTER — Other Ambulatory Visit: Payer: 59

## 2017-11-12 ENCOUNTER — Encounter: Payer: Self-pay | Admitting: Nurse Practitioner

## 2017-11-14 ENCOUNTER — Ambulatory Visit: Payer: 59 | Admitting: Nurse Practitioner

## 2017-11-25 ENCOUNTER — Other Ambulatory Visit: Payer: Self-pay

## 2017-11-25 DIAGNOSIS — F320 Major depressive disorder, single episode, mild: Secondary | ICD-10-CM

## 2017-11-25 DIAGNOSIS — F419 Anxiety disorder, unspecified: Secondary | ICD-10-CM

## 2017-11-25 NOTE — Telephone Encounter (Signed)
Patient is requesting that her venlafixine RX 75mg  be sent to [facility] [place] for 90 day supply.

## 2017-11-26 MED ORDER — VENLAFAXINE HCL ER 75 MG PO CP24: 75.0000 mg | ORAL_CAPSULE | Freq: Every day | ORAL | 0 refills | Status: DC

## 2017-11-26 NOTE — Addendum Note (Signed)
Addended by: Lonna Cobb on: 11/26/2017 02:52 PM   Modules accepted: Orders

## 2017-11-26 NOTE — Addendum Note (Signed)
Addended by: Wilhelmina Mcardle R on: 11/26/2017 03:15 PM   Modules accepted: Orders

## 2017-11-26 NOTE — Telephone Encounter (Signed)
The pt was notified that per her last visit she was notified that she needs to f/u in 3 mths. She needs to be seen in the office in order to get a 90 day script, but a 30 day prescription was sent over. She verbal understanding.

## 2017-12-19 ENCOUNTER — Other Ambulatory Visit: Payer: Self-pay | Admitting: Nurse Practitioner

## 2017-12-19 DIAGNOSIS — F320 Major depressive disorder, single episode, mild: Secondary | ICD-10-CM

## 2017-12-19 DIAGNOSIS — F419 Anxiety disorder, unspecified: Secondary | ICD-10-CM

## 2017-12-24 ENCOUNTER — Other Ambulatory Visit: Payer: 59

## 2017-12-24 ENCOUNTER — Other Ambulatory Visit: Payer: Self-pay | Admitting: Nurse Practitioner

## 2017-12-24 DIAGNOSIS — E785 Hyperlipidemia, unspecified: Secondary | ICD-10-CM

## 2017-12-24 LAB — COMPLETE METABOLIC PANEL WITH GFR
AG Ratio: 1.8 (calc) (ref 1.0–2.5)
ALT: 38 U/L — ABNORMAL HIGH (ref 6–29)
AST: 25 U/L (ref 10–35)
Albumin: 4.5 g/dL (ref 3.6–5.1)
Alkaline phosphatase (APISO): 102 U/L (ref 33–130)
BUN: 17 mg/dL (ref 7–25)
CO2: 28 mmol/L (ref 20–32)
Calcium: 9.4 mg/dL (ref 8.6–10.4)
Chloride: 103 mmol/L (ref 98–110)
Creat: 0.62 mg/dL (ref 0.50–1.05)
GFR, Est African American: 118 mL/min/{1.73_m2} (ref >=60)
GFR, Est Non African American: 102 mL/min/{1.73_m2} (ref >=60)
Globulin: 2.5 g/dL (calc) (ref 1.9–3.7)
Glucose, Bld: 84 mg/dL (ref 65–99)
Potassium: 4.5 mmol/L (ref 3.5–5.3)
Sodium: 139 mmol/L (ref 135–146)
Total Bilirubin: 0.6 mg/dL (ref 0.2–1.2)
Total Protein: 7 g/dL (ref 6.1–8.1)

## 2017-12-24 LAB — LIPID PANEL
Cholesterol: 244 mg/dL — ABNORMAL HIGH (ref <200)
HDL: 50 mg/dL — ABNORMAL LOW (ref >50)
LDL Cholesterol (Calc): 160 mg/dL (calc) — ABNORMAL HIGH
Non-HDL Cholesterol (Calc): 194 mg/dL (calc) — ABNORMAL HIGH (ref <130)
Total CHOL/HDL Ratio: 4.9 (calc) (ref <5.0)
Triglycerides: 186 mg/dL — ABNORMAL HIGH (ref <150)

## 2017-12-26 ENCOUNTER — Ambulatory Visit (INDEPENDENT_AMBULATORY_CARE_PROVIDER_SITE_OTHER): Payer: 59 | Admitting: Nurse Practitioner

## 2017-12-26 ENCOUNTER — Encounter: Payer: Self-pay | Admitting: Nurse Practitioner

## 2017-12-26 ENCOUNTER — Other Ambulatory Visit: Payer: Self-pay

## 2017-12-26 VITALS — BP 116/58 | HR 87 | Temp 97.7°F | Ht 66.0 in | Wt 205.4 lb

## 2017-12-26 DIAGNOSIS — E782 Mixed hyperlipidemia: Secondary | ICD-10-CM | POA: Diagnosis not present

## 2017-12-26 DIAGNOSIS — F320 Major depressive disorder, single episode, mild: Secondary | ICD-10-CM | POA: Diagnosis not present

## 2017-12-26 DIAGNOSIS — F419 Anxiety disorder, unspecified: Secondary | ICD-10-CM

## 2017-12-26 DIAGNOSIS — Z23 Encounter for immunization: Secondary | ICD-10-CM

## 2017-12-26 MED ORDER — ROSUVASTATIN CALCIUM 20 MG PO TABS: 20.0000 mg | ORAL_TABLET | Freq: Every day | ORAL | 3 refills | Status: DC

## 2017-12-26 MED ORDER — VENLAFAXINE HCL ER 37.5 MG PO CP24: 37.5000 mg | ORAL_CAPSULE | Freq: Every day | ORAL | 1 refills | Status: DC

## 2017-12-26 MED ORDER — ROSUVASTATIN CALCIUM 20 MG PO TABS: 20.0000 mg | ORAL_TABLET | Freq: Every day | ORAL | 0 refills | Status: DC

## 2017-12-26 MED ORDER — VENLAFAXINE HCL ER 37.5 MG PO CP24: 37.5000 mg | ORAL_CAPSULE | Freq: Every day | ORAL | 0 refills | Status: DC

## 2017-12-26 NOTE — Progress Notes (Signed)
Subjective:    Patient ID: Christy Watson, female    DOB: 03/04/1960, 57 y.o.   MRN: [id number]  Christy Watson is a 57 y.o. female presenting on 12/26/2017 for Anxiety and Hyperlipidemia   HPI Anxiety and depression Is taking venlafaxine only every other day to every 2-3 days.  Has had a period with sadness and becoming easily emotional with easy crying about a month ago that is resolved.  Feels she can decrease dose or consider stopping this med in future.  Hyperlipidemia Patient is only taking her statin 1-2 x per week.  She is not having symptoms, but forgets to take her medication. - Pt denies changes in vision, chest tightness/pressure, palpitations, shortness of breath, leg pain while walking, leg or arm weakness, and sudden loss of speech or loss of consciousness.   GAD 7 : Generalized Anxiety Score 12/26/2017 08/13/2017  Nervous, Anxious, on Edge 0 1  Control/stop worrying 0 0  Worry too much - different things 0 0  Trouble relaxing 1 1  Restless 0 1  Easily annoyed or irritable 0 0  Afraid - awful might happen 0 0  Total GAD 7 Score 1 3  Anxiety Difficulty Not difficult at all Somewhat difficult     Depression screen [facility] 2/9 12/26/2017 08/13/2017 07/15/2017  Decreased Interest 0 0 0  Down, Depressed, Hopeless 0 0 0  PHQ - 2 Score 0 0 0  Altered sleeping 1 3 2   Tired, decreased energy 1 1 1   Change in appetite 0 0 2  Feeling bad or failure about yourself  0 0 0  Trouble concentrating 0 0 0  Moving slowly or fidgety/restless 0 0 0  Suicidal thoughts 0 0 0  PHQ-9 Score 2 4 5   Difficult doing work/chores Somewhat difficult Somewhat difficult Somewhat difficult    Social History   Tobacco Use  . Smoking status: Never Smoker  . Smokeless tobacco: Never Used  Substance Use Topics  . Alcohol use: No    Alcohol/week: 0.0 standard drinks  . Drug use: No    Review of Systems Per HPI unless specifically indicated above     Objective:    BP (!) 116/58 (BP  Location: Right Arm, Patient Position: Sitting, Cuff Size: Normal)   Pulse 87   Temp 97.7 F (36.5 C) (Oral)   Ht 5\' 6"  (1.676 m)   Wt 205 lb 6.4 oz (93.2 kg)   BMI 33.15 kg/m   Wt Readings from Last 3 Encounters:  12/26/17 205 lb 6.4 oz (93.2 kg)  08/13/17 208 lb 9.6 oz (94.6 kg)  07/15/17 210 lb (95.3 kg)    Physical Exam  Constitutional: She is oriented to person, place, and time. She appears well-developed and well-nourished. No distress.  HENT:  Head: Normocephalic and atraumatic.  Neck: Normal range of motion. Neck supple. Carotid bruit is not present.  Cardiovascular: Normal rate, regular rhythm, S1 normal, S2 normal, normal heart sounds and intact distal pulses.  Pulmonary/Chest: Effort normal and breath sounds normal. No respiratory distress.  Musculoskeletal: She exhibits no edema (pedal).  Neurological: She is alert and oriented to person, place, and time.  Skin: Skin is warm and dry.  Psychiatric: She has a normal mood and affect. Her behavior is normal.  Vitals reviewed.    Results for orders placed or performed in visit on 12/24/17  COMPLETE METABOLIC PANEL WITH GFR  Result Value Ref Range   Glucose, Bld 84 65 - 99 mg/dL   BUN 17  7 - 25 mg/dL   Creat 1.610.62 0.960.50 - 0.451.05 mg/dL   GFR, Est Non African American 102 > OR = 60 mL/min/1.5273m2   GFR, Est African American 118 > OR = 60 mL/min/1.4573m2   BUN/Creatinine Ratio NOT APPLICABLE 6 - 22 (calc)   Sodium 139 135 - 146 mmol/L   Potassium 4.5 3.5 - 5.3 mmol/L   Chloride 103 98 - 110 mmol/L   CO2 28 20 - 32 mmol/L   Calcium 9.4 8.6 - 10.4 mg/dL   Total Protein 7.0 6.1 - 8.1 g/dL   Albumin 4.5 3.6 - 5.1 g/dL   Globulin 2.5 1.9 - 3.7 g/dL (calc)   AG Ratio 1.8 1.0 - 2.5 (calc)   Total Bilirubin 0.6 0.2 - 1.2 mg/dL   Alkaline phosphatase (APISO) 102 33 - 130 U/L   AST 25 10 - 35 U/L   ALT 38 (H) 6 - 29 U/L  Lipid panel  Result Value Ref Range   Cholesterol 244 (H) <200 mg/dL   HDL 50 (L) >40>50 mg/dL   Triglycerides  981186 (H) <150 mg/dL   LDL Cholesterol (Calc) 160 (H) mg/dL (calc)   Total CHOL/HDL Ratio 4.9 <5.0 (calc)   Non-HDL Cholesterol (Calc) 194 (H) <130 mg/dL (calc)      Assessment & Plan:   Problem List Items Addressed This Visit      Other   Mixed hyperlipidemia Status unknown.  Recheck labs.  Continue meds with change to daily dosing today.  STOP simvastain.  START rosuvastatin 20 mg once daily.  Refills provided. Followup in 3 mos after labs.    Relevant Medications   rosuvastatin (CRESTOR) 20 MG tablet (Start on 01/23/2018)   rosuvastatin (CRESTOR) 20 MG tablet   Mild major depression (HCC) Improved today on exam.  Medications tolerated without side effects.  Reduce Venlafaxine to 37.5 mg and take daily. Refills provided.   . Followup 3 months. Consider discontinuing med at next visit if remains stable.   Relevant Medications   venlafaxine XR (EFFEXOR XR) 37.5 MG 24 hr capsule (Start on 01/23/2018)   venlafaxine XR (EFFEXOR XR) 37.5 MG 24 hr capsule   Anxiety - Primary See AP depression above.   Relevant Medications   venlafaxine XR (EFFEXOR XR) 37.5 MG 24 hr capsule (Start on 01/23/2018)   venlafaxine XR (EFFEXOR XR) 37.5 MG 24 hr capsule    Other Visit Diagnoses    Needs flu shot     Pt < age 55.  Needs annual influenza vaccine.  Plan: 1. Administer Quad flu vaccine.    Relevant Orders   Flu Vaccine QUAD 6+ mos PF IM (Fluarix Quad PF) (Completed)      Meds ordered this encounter  Medications  . venlafaxine XR (EFFEXOR XR) 37.5 MG 24 hr capsule    Sig: Take 1 capsule (37.5 mg total) by mouth daily with breakfast.    Dispense:  90 capsule    Refill:  1    Order Specific Question:   Supervising Provider    Answer:   Smitty CordsKARAMALEGOS, ALEXANDER J [[id number]]  . rosuvastatin (CRESTOR) 20 MG tablet    Sig: Take 1 tablet (20 mg total) by mouth daily.    Dispense:  90 tablet    Refill:  3    Order Specific Question:   Supervising Provider    Answer:   Smitty CordsKARAMALEGOS, ALEXANDER J [[id number]]    . rosuvastatin (CRESTOR) 20 MG tablet    Sig: Take 1 tablet (20 mg total) by mouth daily.  Dispense:  30 tablet    Refill:  0    Order Specific Question:   Supervising Provider    Answer:   Smitty Cords [[id number]]  . venlafaxine XR (EFFEXOR XR) 37.5 MG 24 hr capsule    Sig: Take 1 capsule (37.5 mg total) by mouth daily with breakfast.    Dispense:  30 capsule    Refill:  0    Order Specific Question:   Supervising Provider    Answer:   Smitty Cords [[id number]]    Follow up plan: Return in about 3 months (around 03/28/2018) for Cholesterol, anxiety.  Wilhelmina Mcardle, DNP, AGPCNP-BC Adult Gerontology Primary Care Nurse Practitioner [facility] 12/26/2017, 8:09 AM

## 2017-12-26 NOTE — Patient Instructions (Addendum)
Christy BihariSharise L Watson,   Thank you for coming in to clinic today.  1. Work on reducing refined sugars and carbohydrates in your diet.   2. CHANGE simvastatin to rosuvastatin 20 mg daily.  3. CHANGE venlafaxine XR 37.5 mg once daily.   Please schedule a follow-up appointment with Wilhelmina McardleLauren Shamarcus Hoheisel, AGNP. Return in about 3 months (around 03/28/2018) for Cholesterol, anxiety.  If you have any other questions or concerns, please feel free to call the clinic or send a message through MyChart. You may also schedule an earlier appointment if necessary.  You will receive a survey after today's visit either digitally by e-mail or paper by [employer] mail. Your experiences and feedback matter to us.  Please respond so we know how we are doing as we provide care for you.   Wilhelmina McardleLauren Karliah Kowalchuk, DNP, AGNP-BC Adult Gerontology Nurse Practitioner [facility], [facility]

## 2018-01-02 ENCOUNTER — Encounter: Payer: Self-pay | Admitting: Nurse Practitioner

## 2018-01-13 ENCOUNTER — Ambulatory Visit: Admission: RE | Admit: 2018-01-13 | Payer: 59 | Source: Ambulatory Visit | Admitting: Gastroenterology

## 2018-01-13 ENCOUNTER — Encounter: Admission: RE | Payer: Self-pay | Source: Ambulatory Visit

## 2018-01-13 SURGERY — COLONOSCOPY WITH PROPOFOL
Anesthesia: Choice

## 2018-01-19 ENCOUNTER — Other Ambulatory Visit: Payer: Self-pay | Admitting: Nurse Practitioner

## 2018-01-19 DIAGNOSIS — E782 Mixed hyperlipidemia: Secondary | ICD-10-CM

## 2018-01-20 MED ORDER — ROSUVASTATIN CALCIUM 20 MG PO TABS: 20.0000 mg | ORAL_TABLET | Freq: Every day | ORAL | 3 refills | Status: DC

## 2018-03-25 ENCOUNTER — Other Ambulatory Visit: Payer: 59

## 2018-03-25 DIAGNOSIS — E782 Mixed hyperlipidemia: Secondary | ICD-10-CM

## 2018-03-26 LAB — LIPID PANEL
Cholesterol: 119 mg/dL (ref <200)
HDL: 48 mg/dL — ABNORMAL LOW (ref >=50)
LDL Cholesterol (Calc): 53 mg/dL (calc)
Non-HDL Cholesterol (Calc): 71 mg/dL (calc) (ref <130)
Total CHOL/HDL Ratio: 2.5 (calc) (ref <5.0)
Triglycerides: 96 mg/dL (ref <150)

## 2018-03-26 LAB — COMPLETE METABOLIC PANEL WITH GFR
AG Ratio: 2.1 (calc) (ref 1.0–2.5)
ALT: 39 U/L — ABNORMAL HIGH (ref 6–29)
AST: 29 U/L (ref 10–35)
Albumin: 4.6 g/dL (ref 3.6–5.1)
Alkaline phosphatase (APISO): 83 U/L (ref 37–153)
BUN: 13 mg/dL (ref 7–25)
CO2: 28 mmol/L (ref 20–32)
Calcium: 9.5 mg/dL (ref 8.6–10.4)
Chloride: 104 mmol/L (ref 98–110)
Creat: 0.69 mg/dL (ref 0.50–1.05)
GFR, Est African American: 114 mL/min/{1.73_m2} (ref >=60)
GFR, Est Non African American: 98 mL/min/{1.73_m2} (ref >=60)
Globulin: 2.2 g/dL (calc) (ref 1.9–3.7)
Glucose, Bld: 93 mg/dL (ref 65–99)
Potassium: 4.6 mmol/L (ref 3.5–5.3)
Sodium: 141 mmol/L (ref 135–146)
Total Bilirubin: 0.5 mg/dL (ref 0.2–1.2)
Total Protein: 6.8 g/dL (ref 6.1–8.1)

## 2018-03-28 ENCOUNTER — Other Ambulatory Visit: Payer: Self-pay

## 2018-03-28 ENCOUNTER — Ambulatory Visit (INDEPENDENT_AMBULATORY_CARE_PROVIDER_SITE_OTHER): Payer: 59 | Admitting: Nurse Practitioner

## 2018-03-28 ENCOUNTER — Encounter: Payer: Self-pay | Admitting: Nurse Practitioner

## 2018-03-28 VITALS — BP 120/64 | HR 75 | Temp 98.4°F | Ht 66.0 in | Wt 197.7 lb

## 2018-03-28 DIAGNOSIS — Z23 Encounter for immunization: Secondary | ICD-10-CM | POA: Diagnosis not present

## 2018-03-28 DIAGNOSIS — E782 Mixed hyperlipidemia: Secondary | ICD-10-CM | POA: Diagnosis not present

## 2018-03-28 DIAGNOSIS — F419 Anxiety disorder, unspecified: Secondary | ICD-10-CM

## 2018-03-28 DIAGNOSIS — F320 Major depressive disorder, single episode, mild: Secondary | ICD-10-CM

## 2018-03-28 DIAGNOSIS — K219 Gastro-esophageal reflux disease without esophagitis: Secondary | ICD-10-CM

## 2018-03-28 DIAGNOSIS — R7401 Elevation of levels of liver transaminase levels: Secondary | ICD-10-CM

## 2018-03-28 DIAGNOSIS — R740 Nonspecific elevation of levels of transaminase and lactic acid dehydrogenase [LDH]: Secondary | ICD-10-CM

## 2018-03-28 MED ORDER — OMEPRAZOLE 20 MG PO CPDR: DELAYED_RELEASE_CAPSULE | ORAL | 1 refills | Status: DC

## 2018-03-28 MED ORDER — VENLAFAXINE HCL ER 37.5 MG PO CP24: 37.5000 mg | ORAL_CAPSULE | Freq: Every day | ORAL | 4 refills | Status: DC

## 2018-03-28 MED ORDER — ROSUVASTATIN CALCIUM 20 MG PO TABS: 20.0000 mg | ORAL_TABLET | Freq: Every day | ORAL | 4 refills | Status: DC

## 2018-03-28 NOTE — Progress Notes (Signed)
Subjective:    Patient ID: Christy BihariSharise L Schamberger, female    DOB: [birth year], 58 y.o.   MRN: [id number]  Christy Watson is a 58 y.o. female presenting on 03/28/2018 for Anxiety and Hyperlipidemia   HPI Hyperlipidemia Patient is taking rosuvastatin 20 mg once daily and tolerates well.  She admits she has had no problems once resuming daily dosing again.  - Patient is exercising 3-4 times per week cardio about 30 minutes. Elliptical usually. - Has had mild chest pain (I think it's just gas and usually in evenings after eating). Otherwise, has no signs and symptoms of ascvd. - Pt denies changes in vision, chest pressure, palpitations, shortness of breath, leg pain while walking, leg or arm weakness, and sudden loss of speech or loss of consciousness.   Anxiety Patient continues on Effextor and notes less stress overall as helping her control her anxiety.  Job is less stressful than any other job in last 5 years. - Feels she is able to "focus on me sometimes."  GAD 7 : Generalized Anxiety Score 03/28/2018 12/26/2017 08/13/2017  Nervous, Anxious, on Edge 0 0 1  Control/stop worrying 0 0 0  Worry too much - different things 0 0 0  Trouble relaxing 1 1 1   Restless 0 0 1  Easily annoyed or irritable 0 0 0  Afraid - awful might happen 0 0 0  Total GAD 7 Score 1 1 3   Anxiety Difficulty Not difficult at all Not difficult at all Somewhat difficult     [facility] 2/9 03/28/2018 12/26/2017 08/13/2017 07/15/2017  Decreased Interest 0 0 0 0  Down, Depressed, Hopeless 0 0 0 0  PHQ - 2 Score 0 0 0 0  Altered sleeping 0 1 3 2   Tired, decreased energy 0 1 1 1   Change in appetite 0 0 0 2  Feeling bad or failure about yourself  2 0 0 0  Trouble concentrating 0 0 0 0  Moving slowly or fidgety/restless 0 0 0 0  Suicidal thoughts 0 0 0 0  PHQ-9 Score 2 2 4 5   Difficult doing work/chores Not difficult at all Somewhat difficult Somewhat difficult Somewhat difficult    Social History   Tobacco  Use  . Smoking status: Never Smoker  . Smokeless tobacco: Never Used  Substance Use Topics  . Alcohol use: No    Alcohol/week: 0.0 standard drinks  . Drug use: No    Review of Systems Per HPI unless specifically indicated above     Objective:    BP 120/64 (BP Location: Right Arm, Patient Position: Sitting, Cuff Size: Normal)   Pulse 75   Temp 98.4 F (36.9 C) (Oral)   Ht 5\' 6"  (1.676 m)   Wt 197 lb 11.2 oz (89.7 kg)   BMI 31.91 kg/m   Wt Readings from Last 3 Encounters:  03/28/18 197 lb 11.2 oz (89.7 kg)  12/26/17 205 lb 6.4 oz (93.2 kg)  08/13/17 208 lb 9.6 oz (94.6 kg)    Physical Exam Vitals signs reviewed.  Constitutional:      General: She is awake. She is not in acute distress.    Appearance: She is well-developed.  HENT:     Head: Normocephalic and atraumatic.  Neck:     Musculoskeletal: Normal range of motion and neck supple.     Vascular: No carotid bruit.  Cardiovascular:     Rate and Rhythm: Normal rate and regular rhythm.     Pulses:  Radial pulses are 2+ on the right side and 2+ on the left side.       Posterior tibial pulses are 1+ on the right side and 1+ on the left side.     Heart sounds: Normal heart sounds, S1 normal and S2 normal.  Pulmonary:     Effort: Pulmonary effort is normal. No respiratory distress.     Breath sounds: Normal breath sounds and air entry.  Abdominal:     General: Bowel sounds are normal. There is no distension.     Palpations: Abdomen is soft.     Tenderness: There is no abdominal tenderness.     Hernia: No hernia is present.  Skin:    General: Skin is warm and dry.     Capillary Refill: Capillary refill takes less than 2 seconds.  Neurological:     General: No focal deficit present.     Mental Status: She is alert and oriented to person, place, and time. Mental status is at baseline.  Psychiatric:        Attention and Perception: Attention normal.        Mood and Affect: Mood and affect normal.         Behavior: Behavior normal. Behavior is cooperative.        Thought Content: Thought content normal.        Judgment: Judgment normal.    Results for orders placed or performed in visit on 03/25/18  Lipid panel  Result Value Ref Range   Cholesterol 119 <200 mg/dL   HDL 48 (L) > OR = 50 mg/dL   Triglycerides 96 <245 mg/dL   LDL Cholesterol (Calc) 53 mg/dL (calc)   Total CHOL/HDL Ratio 2.5 <5.0 (calc)   Non-HDL Cholesterol (Calc) 71 <809 mg/dL (calc)  COMPLETE METABOLIC PANEL WITH GFR  Result Value Ref Range   Glucose, Bld 93 65 - 99 mg/dL   BUN 13 7 - 25 mg/dL   Creat 9.83 3.82 - 5.05 mg/dL   GFR, Est Non African American 98 > OR = 60 mL/min/1.61m2   GFR, Est African American 114 > OR = 60 mL/min/1.41m2   BUN/Creatinine Ratio NOT APPLICABLE 6 - 22 (calc)   Sodium 141 135 - 146 mmol/L   Potassium 4.6 3.5 - 5.3 mmol/L   Chloride 104 98 - 110 mmol/L   CO2 28 20 - 32 mmol/L   Calcium 9.5 8.6 - 10.4 mg/dL   Total Protein 6.8 6.1 - 8.1 g/dL   Albumin 4.6 3.6 - 5.1 g/dL   Globulin 2.2 1.9 - 3.7 g/dL (calc)   AG Ratio 2.1 1.0 - 2.5 (calc)   Total Bilirubin 0.5 0.2 - 1.2 mg/dL   Alkaline phosphatase (APISO) 83 37 - 153 U/L   AST 29 10 - 35 U/L   ALT 39 (H) 6 - 29 U/L      Assessment & Plan:   Problem List Items Addressed This Visit      Other   Mixed hyperlipidemia Previously uncontrolled hyperlipidemia.  Currently taking rosuvastatin 20 mg once daily. No new ASCVD events since last visit, but need to monitor postprandial chest pain with treatment for GERD.   - No personal history of prior ASCVD events  Plan: 1. CONTINUE taking rosuvastatin 20 mg once daily. - Reviewed common side effects of myalgia (reversible off med), less common side effects of cognitive impairment (reversible off med), increased glucose, rhabdomyolysis. 2. Discussed low glycemic diet and reinforced lifestyle changes. 3. Discussed increasing exercise to 30  minutes most days of the week.  Reinforced existing  changes. 4. Labs today 5. Follow-up with labs in about 1 year.    Relevant Medications   rosuvastatin (CRESTOR) 20 MG tablet   Other Relevant Orders   COMPLETE METABOLIC PANEL WITH GFR   Lipid panel   TSH   Mild major depression (HCC)   Anxiety Stable and improved anxiety and depression, both in remission currently on Effexor 37.5 mg once daily.    Plan: 1. Patient is interested in stopping medication since stress levels overall have improved.  - Discussed taking every other day x 2 weeks.  Then take every 3 days for 1 week and stop.  - May resume if needed and continue at 37.5 mg dose 2. Continue exercise and non-pharm stress management strategies. 3. Follow-up 1 year or sooner prn.   Relevant Medications   venlafaxine XR (EFFEXOR XR) 37.5 MG 24 hr capsule    Other Visit Diagnoses    Gastroesophageal reflux disease without esophagitis    -  Primary Patient with chest burning at night after meals when lying flat.  Cannot fully exclude cardiac cause, however timing is most indicative of GERD.  Patient declines cardiac workup.  Plan: 1. Start omeprazole 20 mg once daily for 14 days. Repeat as needed or continue.  If taking daily, follow-up in 6 months. 2. Labs today 3. Monitor for reduction of chest symptoms.  If no improvement, reiterated that patient will need to have cardiac workup completed. 4. Follow-up prn.   Relevant Medications   omeprazole (PRILOSEC) 20 MG capsule   Other Relevant Orders   CBC with Differential/Platelet   Need for shingles vaccine       Relevant Orders   Varicella-zoster vaccine IM (Shingrix) (Completed)   Elevated ALT measurement       Relevant Orders   COMPLETE METABOLIC PANEL WITH GFR      Meds ordered this encounter  Medications  . omeprazole (PRILOSEC) 20 MG capsule    Sig: Take 1 tablet daily for 14 days, then as needed repeat 14 day course.    Dispense:  30 capsule    Refill:  1    Order Specific Question:   Supervising Provider     Answer:   Smitty Cords [[id number]]  . rosuvastatin (CRESTOR) 20 MG tablet    Sig: Take 1 tablet (20 mg total) by mouth daily.    Dispense:  90 tablet    Refill:  4    Order Specific Question:   Supervising Provider    Answer:   Smitty Cords [[id number]]  . venlafaxine XR (EFFEXOR XR) 37.5 MG 24 hr capsule    Sig: Take 1 capsule (37.5 mg total) by mouth daily with breakfast.    Dispense:  90 capsule    Refill:  4    Order Specific Question:   Supervising Provider    Answer:   Smitty Cords [[id number]]    Follow up plan: Return in about 1 year (around 03/29/2019) for Cholesterol, labs 2-3 days prior.  Wilhelmina Mcardle, DNP, AGPCNP-BC Adult Gerontology Primary Care Nurse Practitioner [facility] 03/28/2018, 8:47 AM

## 2018-03-28 NOTE — Patient Instructions (Addendum)
Tonette Bihari,   Thank you for coming in to clinic today.  1. START omeprazole 14 mg one tablet daily for 14 days.  May repeat for 14 days in future as needed.  Also, may continue daily if needed.  Call clinic if taking daily for additional refill and follow-up at 6 months.  2. May consider reducing Effexor to quit.  Take every other day for 2 weeks, then every 3 days for 1 week and stop. - If anxiety returns,  Resume and continue until next visit.  Call for appointment if needed sooner than 1 year.  3. Continue rosuvastatin daily.  This helps keep your heart attack and stroke risk low.  Please schedule a follow-up appointment with Wilhelmina Mcardle, AGNP. Return in about 1 year (around 03/29/2019) for Cholesterol, labs 2-3 days prior.   If you have any other questions or concerns, please feel free to call the clinic or send a message through MyChart. You may also schedule an earlier appointment if necessary.  You will receive a survey after today's visit either digitally by e-mail or paper by [employer]. Your experiences and feedback matter to [place].  Please respond so we know how we are doing as we provide care for you.   Wilhelmina Mcardle, DNP, AGNP-BC Adult Gerontology Nurse Practitioner [facility], [facility]

## 2018-03-31 ENCOUNTER — Encounter: Payer: Self-pay | Admitting: Nurse Practitioner

## 2018-05-28 ENCOUNTER — Ambulatory Visit (INDEPENDENT_AMBULATORY_CARE_PROVIDER_SITE_OTHER): Payer: 59

## 2018-05-28 ENCOUNTER — Other Ambulatory Visit: Payer: Self-pay

## 2018-05-28 DIAGNOSIS — Z23 Encounter for immunization: Secondary | ICD-10-CM | POA: Diagnosis not present

## 2018-07-28 ENCOUNTER — Ambulatory Visit: Payer: 59 | Admitting: Obstetrics and Gynecology

## 2018-07-28 ENCOUNTER — Other Ambulatory Visit: Payer: Self-pay

## 2018-08-11 ENCOUNTER — Other Ambulatory Visit: Payer: Self-pay | Admitting: Obstetrics and Gynecology

## 2018-08-11 DIAGNOSIS — Z1231 Encounter for screening mammogram for malignant neoplasm of breast: Secondary | ICD-10-CM

## 2018-08-20 ENCOUNTER — Ambulatory Visit
Admission: RE | Admit: 2018-08-20 | Discharge: 2018-08-20 | Disposition: A | Discharge status: Home / Self Care | Payer: 59 | Source: Ambulatory Visit | Attending: Obstetrics and Gynecology | Admitting: Obstetrics and Gynecology

## 2018-08-20 ENCOUNTER — Ambulatory Visit: Payer: 59

## 2018-08-20 ENCOUNTER — Other Ambulatory Visit: Payer: Self-pay

## 2018-08-20 DIAGNOSIS — Z1231 Encounter for screening mammogram for malignant neoplasm of breast: Secondary | ICD-10-CM | POA: Diagnosis present

## 2018-08-29 ENCOUNTER — Ambulatory Visit: Payer: 59 | Admitting: Obstetrics and Gynecology

## 2018-09-03 ENCOUNTER — Encounter: Payer: Self-pay | Admitting: Obstetrics and Gynecology

## 2018-09-03 ENCOUNTER — Ambulatory Visit (INDEPENDENT_AMBULATORY_CARE_PROVIDER_SITE_OTHER): Payer: 59 | Admitting: Obstetrics and Gynecology

## 2018-09-03 ENCOUNTER — Other Ambulatory Visit: Payer: Self-pay

## 2018-09-03 VITALS — BP 118/74 | HR 73 | Ht 66.0 in | Wt 174.0 lb

## 2018-09-03 DIAGNOSIS — Z1239 Encounter for other screening for malignant neoplasm of breast: Secondary | ICD-10-CM

## 2018-09-03 DIAGNOSIS — Z01419 Encounter for gynecological examination (general) (routine) without abnormal findings: Secondary | ICD-10-CM | POA: Diagnosis not present

## 2018-09-03 NOTE — Progress Notes (Signed)
Gynecology Annual Exam  PCP: Mikey College, NP  Chief Complaint:  Chief Complaint  Patient presents with  . Gynecologic Exam    History of Present Illness:Patient is a 58 y.o. G2P0011 presents for annual exam. The patient has no complaints today.   LMP: No LMP recorded. Patient is postmenopausal. No postmenopausal bleeding  The patient is sexually active. She denies dyspareunia.  The patient does perform self breast exams.  There is no notable family history of breast or ovarian cancer in her family.  The patient wears seatbelts: yes.   The patient has regular exercise: not asked.    The patient denies current symptoms of depression.     Review of Systems: Review of Systems  Constitutional: Negative for chills and fever.  HENT: Negative for congestion.   Respiratory: Negative for cough and shortness of breath.   Cardiovascular: Negative for chest pain and palpitations.  Gastrointestinal: Negative for abdominal pain, constipation, diarrhea, heartburn, nausea and vomiting.  Genitourinary: Negative for dysuria, frequency and urgency.  Skin: Negative for itching and rash.  Neurological: Negative for dizziness and headaches.  Endo/Heme/Allergies: Negative for polydipsia.  Psychiatric/Behavioral: Negative for depression.    Past Medical History:  Past Medical History:  Diagnosis Date  . Allergy   . Depression   . Hypercholesterolemia     Past Surgical History:  Past Surgical History:  Procedure Laterality Date  . CHOLECYSTECTOMY  2010    Gynecologic History:  No LMP recorded. Patient is postmenopausal. Last Pap: Results were: 07/15/2017 NIL and HR HPV negative  Last mammogram: 08/20/2018 Results were: BI-RAD I  Obstetric History: G2P0011  Family History:  Family History  Problem Relation Age of Onset  . Autoimmune disease Mother   . Diabetes Father   . CVA Father   . Hyperlipidemia Father   . Thyroid disease Sister   . Autoimmune disease Sister   .  Hyperlipidemia Brother   . Hypertension Brother   . Graves' disease Brother   . Thyroid disease Maternal Aunt   . Bone cancer Maternal Grandfather   . Breast cancer Paternal Grandmother 58  . Brain cancer Paternal Grandfather 67    Social History:  Social History   Socioeconomic History  . Marital status: Married    Spouse name: Not on file  . Number of children: Not on file  . Years of education: Not on file  . Highest education level: Not on file  Occupational History  . Not on file  Social Needs  . Financial resource strain: Not on file  . Food insecurity    Worry: Not on file    Inability: Not on file  . Transportation needs    Medical: Not on file    Non-medical: Not on file  Tobacco Use  . Smoking status: Never Smoker  . Smokeless tobacco: Never Used  Substance and Sexual Activity  . Alcohol use: No    Alcohol/week: 0.0 standard drinks  . Drug use: No  . Sexual activity: Not Currently    Birth control/protection: None  Lifestyle  . Physical activity    Days per week: 0 days    Minutes per session: Not on file  . Stress: Not on file  Relationships  . Social Herbalist on phone: Not on file    Gets together: Not on file    Attends religious service: Not on file    Active member of club or organization: Not on file    Attends  meetings of clubs or organizations: Not on file    Relationship status: Not on file  . Intimate partner violence    Fear of current or ex partner: Not on file    Emotionally abused: Not on file    Physically abused: Not on file    Forced sexual activity: Not on file  Other Topics Concern  . Not on file  Social History Narrative  . Not on file    Allergies:  Allergies  Allergen Reactions  . Sulfa Antibiotics Hives    Medications: Prior to Admission medications   Medication Sig Start Date End Date Taking? Authorizing Provider  rosuvastatin (CRESTOR) 20 MG tablet Take 1 tablet (20 mg total) by mouth daily. 03/28/18   Yes Galen ManilaKennedy, Lauren Renee, NP  venlafaxine XR (EFFEXOR XR) 37.5 MG 24 hr capsule Take 1 capsule (37.5 mg total) by mouth daily with breakfast. 03/28/18  Yes Galen ManilaKennedy, Lauren Renee, NP  azelastine (ASTELIN) 0.1 % nasal spray Place 1 spray into both nostrils 2 (two) times daily. Use in each nostril as directed 08/13/17   Galen ManilaKennedy, Lauren Renee, NP  dicyclomine (BENTYL) 10 MG capsule Take 1 capsule (10 mg total) by mouth 4 (four) times daily -  before meals and at bedtime. Patient not taking: Reported on 03/28/2018 08/13/17   Galen ManilaKennedy, Lauren Renee, NP  omeprazole (PRILOSEC) 20 MG capsule Take 1 tablet daily for 14 days, then as needed repeat 14 day course. 03/28/18   Galen ManilaKennedy, Lauren Renee, NP    Physical Exam Vitals: Blood pressure 118/74, pulse 73, height 5\' 6"  (1.676 m), weight 174 lb (78.9 kg).  General: NAD HEENT: normocephalic, anicteric Thyroid: no enlargement, no palpable nodules Pulmonary: No increased work of breathing, CTAB Cardiovascular: RRR, distal pulses 2+ Breast: Breast symmetrical, no tenderness, no palpable nodules or masses, no skin or nipple retraction present, no nipple discharge.  No axillary or supraclavicular lymphadenopathy. Abdomen: NABS, soft, non-tender, non-distended.  Umbilicus without lesions.  No hepatomegaly, splenomegaly or masses palpable. No evidence of hernia  Genitourinary:  External: Normal external female genitalia.  Normal urethral meatus, normal Bartholin's and Skene's glands.    Vagina: Normal vaginal mucosa, no evidence of prolapse.    Cervix: Grossly normal in appearance, no bleeding  Uterus: Non-enlarged, mobile, normal contour.  No CMT  Adnexa: ovaries non-enlarged, no adnexal masses  Rectal: deferred  Lymphatic: no evidence of inguinal lymphadenopathy Extremities: no edema, erythema, or tenderness Neurologic: Grossly intact Psychiatric: mood appropriate, affect full  Female chaperone present for pelvic and breast  portions of the physical exam      Assessment: 58 y.o. G2P0011 routine annual exam  Plan: Problem List Items Addressed This Visit    None    Visit Diagnoses    Encounter for gynecological examination without abnormal finding    -  Primary   Breast screening          1) Mammogram - recommend yearly screening mammogram.  Mammogram Is up to date  2) STI screening  was notoffered and therefore not obtained  3) ASCCP guidelines and rational discussed.  Patient opts for every 3 years screening interval  4) Osteoporosis  - per USPTF routine screening DEXA at age 56  Consider FDA-approved medical therapies in postmenopausal women and men aged 56 years and older, based on the following: a) A hip or vertebral (clinical or morphometric) fracture b) T-score ? -2.5 at the femoral neck or spine after appropriate evaluation to exclude secondary causes C) Low bone mass (T-score between -1.0  and -2.5 at the femoral neck or spine) and a 10-year probability of a hip fracture ? 3% or a 10-year probability of a major osteoporosis-related fracture ? 20% based on the [place]-adapted WHO algorithm   5) Routine healthcare maintenance including cholesterol, diabetes screening discussed managed by PCP  6) Return in about 1 year (around 09/03/2019) for annual.    Vena AustriaAndreas Nini Cavan, MD [facility] 09/03/2018, 4:06 PM

## 2019-01-11 ENCOUNTER — Other Ambulatory Visit: Payer: Self-pay | Admitting: Nurse Practitioner

## 2019-01-11 DIAGNOSIS — F419 Anxiety disorder, unspecified: Secondary | ICD-10-CM

## 2019-01-11 DIAGNOSIS — F320 Major depressive disorder, single episode, mild: Secondary | ICD-10-CM

## 2019-01-11 DIAGNOSIS — E782 Mixed hyperlipidemia: Secondary | ICD-10-CM

## 2019-03-25 ENCOUNTER — Other Ambulatory Visit: Payer: 59

## 2019-03-30 ENCOUNTER — Ambulatory Visit: Payer: 59 | Admitting: Nurse Practitioner

## 2019-04-08 ENCOUNTER — Telehealth: Payer: Self-pay

## 2019-04-08 ENCOUNTER — Other Ambulatory Visit: Payer: 59

## 2019-04-08 DIAGNOSIS — I10 Essential (primary) hypertension: Secondary | ICD-10-CM

## 2019-04-08 DIAGNOSIS — E782 Mixed hyperlipidemia: Secondary | ICD-10-CM

## 2019-04-08 DIAGNOSIS — Z0000 Encounter for general adult medical examination without abnormal findings: Secondary | ICD-10-CM

## 2019-04-08 DIAGNOSIS — E785 Hyperlipidemia, unspecified: Secondary | ICD-10-CM

## 2019-04-09 LAB — CBC WITH DIFFERENTIAL/PLATELET
Absolute Monocytes: 440 cells/uL (ref 200–950)
Basophils Absolute: 42 cells/uL (ref 0–200)
Basophils Relative: 0.8 %
Eosinophils Absolute: 159 cells/uL (ref 15–500)
Eosinophils Relative: 3 %
HCT: 42.9 % (ref 35.0–45.0)
Hemoglobin: 14.6 g/dL (ref 11.7–15.5)
Lymphs Abs: 1574 cells/uL (ref 850–3900)
MCH: 30.4 pg (ref 27.0–33.0)
MCHC: 34 g/dL (ref 32.0–36.0)
MCV: 89.2 fL (ref 80.0–100.0)
MPV: 9.3 fL (ref 7.5–12.5)
Monocytes Relative: 8.3 %
Neutro Abs: 3085 cells/uL (ref 1500–7800)
Neutrophils Relative %: 58.2 %
Platelets: 226 10*3/uL (ref 140–400)
RBC: 4.81 10*6/uL (ref 3.80–5.10)
RDW: 13 % (ref 11.0–15.0)
Total Lymphocyte: 29.7 %
WBC: 5.3 10*3/uL (ref 3.8–10.8)

## 2019-04-09 LAB — COMPREHENSIVE METABOLIC PANEL
AG Ratio: 1.8 (calc) (ref 1.0–2.5)
ALT: 23 U/L (ref 6–29)
AST: 20 U/L (ref 10–35)
Albumin: 4.5 g/dL (ref 3.6–5.1)
Alkaline phosphatase (APISO): 82 U/L (ref 37–153)
BUN: 12 mg/dL (ref 7–25)
CO2: 31 mmol/L (ref 20–32)
Calcium: 9.6 mg/dL (ref 8.6–10.4)
Chloride: 103 mmol/L (ref 98–110)
Creat: 0.7 mg/dL (ref 0.50–1.05)
Globulin: 2.5 g/dL (calc) (ref 1.9–3.7)
Glucose, Bld: 90 mg/dL (ref 65–99)
Potassium: 4.2 mmol/L (ref 3.5–5.3)
Sodium: 141 mmol/L (ref 135–146)
Total Bilirubin: 0.6 mg/dL (ref 0.2–1.2)
Total Protein: 7 g/dL (ref 6.1–8.1)

## 2019-04-09 LAB — HEMOGLOBIN A1C
Hgb A1c MFr Bld: 5.4 % of total Hgb (ref <5.7)
Mean Plasma Glucose: 108 (calc)
eAG (mmol/L): 6 (calc)

## 2019-04-09 LAB — LIPID PANEL
Cholesterol: 249 mg/dL — ABNORMAL HIGH (ref <200)
HDL: 61 mg/dL (ref >=50)
LDL Cholesterol (Calc): 154 mg/dL (calc) — ABNORMAL HIGH
Non-HDL Cholesterol (Calc): 188 mg/dL (calc) — ABNORMAL HIGH (ref <130)
Total CHOL/HDL Ratio: 4.1 (calc) (ref <5.0)
Triglycerides: 200 mg/dL — ABNORMAL HIGH (ref <150)

## 2019-04-09 LAB — TSH: TSH: 1.01 mIU/L (ref 0.40–4.50)

## 2019-04-14 ENCOUNTER — Ambulatory Visit: Payer: 59 | Admitting: Family Medicine

## 2019-04-17 NOTE — Telephone Encounter (Signed)
Open in error

## 2019-04-23 ENCOUNTER — Ambulatory Visit: Payer: 59 | Admitting: Family Medicine

## 2019-05-04 ENCOUNTER — Ambulatory Visit (INDEPENDENT_AMBULATORY_CARE_PROVIDER_SITE_OTHER): Payer: 59 | Admitting: Family Medicine

## 2019-05-04 ENCOUNTER — Other Ambulatory Visit: Payer: Self-pay

## 2019-05-04 ENCOUNTER — Encounter: Payer: Self-pay | Admitting: Family Medicine

## 2019-05-04 VITALS — Temp 97.0°F | Wt 184.0 lb

## 2019-05-04 DIAGNOSIS — F320 Major depressive disorder, single episode, mild: Secondary | ICD-10-CM

## 2019-05-04 DIAGNOSIS — E782 Mixed hyperlipidemia: Secondary | ICD-10-CM | POA: Diagnosis not present

## 2019-05-04 DIAGNOSIS — J301 Allergic rhinitis due to pollen: Secondary | ICD-10-CM

## 2019-05-04 MED ORDER — VENLAFAXINE HCL ER 75 MG PO CP24: 75.0000 mg | ORAL_CAPSULE | Freq: Every day | ORAL | 1 refills | Status: DC

## 2019-05-04 MED ORDER — FLOVENT DISKUS 100 MCG/BLIST IN AEPB: 2.0000 | INHALATION_SPRAY | Freq: Every day | RESPIRATORY_TRACT | 0 refills | Status: DC

## 2019-05-04 MED ORDER — VENLAFAXINE HCL ER 75 MG PO CP24: 75.0000 mg | ORAL_CAPSULE | Freq: Every day | ORAL | 0 refills | Status: DC

## 2019-05-04 MED ORDER — ROSUVASTATIN CALCIUM 20 MG PO TABS: 20.0000 mg | ORAL_TABLET | Freq: Every day | ORAL | 1 refills | Status: DC

## 2019-05-04 MED ORDER — CETIRIZINE HCL 10 MG PO TABS: 10.0000 mg | ORAL_TABLET | Freq: Every day | ORAL | 0 refills | Status: DC

## 2019-05-04 MED ORDER — ROSUVASTATIN CALCIUM 20 MG PO TABS: 20.0000 mg | ORAL_TABLET | Freq: Every day | ORAL | 0 refills | Status: DC

## 2019-05-04 NOTE — Patient Instructions (Signed)
As we discussed, continue to take your Rosuvastatin daily.  It may be easier to remember to take if you put it with your night time routine.  For example, you may use a certain moisturizer or face wash nightly before bed, it could be helpful to put it next to that so you remember to take it daily.  I have sent in a refill on your Flovent for seasonal asthma, to use as directed.   A prescription for generic Zyrtec was sent to the pharmacy, to take 1x per day for seasonal allergies.  If your insurance company covers this, contact our office and I will send in a 90 day supply to your mail order pharmacy.  A refill on your Venlafaxine 75mg  was sent to the pharmacy to take as directed.  In the future, should you feel that you need an increase in this prescription, please contact so we can discuss and send in the appropriate dosage for you.  Having high HDL (good) cholesterol and low triglyceride levels can reduce your risk of heart attack and stroke.  The following steps can be taken to reduce triglyceride levels and increase HDL (good) cholesterol levels.  Avoid or limit alcohol Alcohol significantly raises triglyceride levels.  If your triglycerides are higher than 150, avoid or limit alcohol.  Limit fruit juice and dried fruits Even fresh fruit juice contains a large amount of fructose sugar and can increase triglyceride and blood sugar levels.  Fruit juice and dried fruit are also high in calories and can add unwanted pounds.  Replace fruit juice and dried fruits with fresh fruit that has more fiber and fewer calories.  Limit non-diet soft drinks and sports drinks Non-diet soft drinks and sport drinks contain as many as 12 teaspoons of sugar per serving.  This sugar can increase triglyceride and blood sugar levels.  Non-diet soft drinks and sport drinks also contain calories that add unwanted pounds.  Replace non-diet soft drinks and sports drinks with water, unsweetened tea, diet colas or  beverages sweetened with Nutra-Sweet.  Limit concentrated sweets Even fat free or low fat sweets can raise your triglyceride and/or blood sugar levels.  Concentrated sweets include sugar, honey, jelly, candy, cookies, cakes, pies, pastries, frozen desserts, puddings, and sugar sweetened cereals.  Replace concentrated sweets with high fiber foods such as fruit, low fat yogurt, sugar free gelatin and low fat puddings.  Limit refined carbohydrates Refined carbohydrates include white flour, white bread, white rice, and some pasta.  Limit portion sizes of these foods or replace them with whole wheat bread, lentils, whole grains, brown rice, and spinach or whole-wheat pastas.  Reduce your weight, if needed Even a 5-10 pound weight loss can cause your triglyceride level to decrease significantly.  You can lose 1-2 pounds per week by limiting your portion sizes and exercising 5-6 times per week.  Include monounsaturated fats in your diet.  Include Monounsaturated fats in your diet Moderate amounts of monounsaturated fat can raise your HDL (good) cholesterol.  Sources of monounsaturated fat include olive oil, canola oil, peanut oil, peanuts, peanut butter, cashews, olives, and avocados.  Choose peanut butter that does not have sugar in the ingredient list.  Since these foods are high in calories, serving sizes should be moderate.  Include fish in your diet Fish is low in saturated fat and rich in Omega-3 fatty acids.  Good choices include mackerel, salmon, herring, bluefish, lake trout, tuna, and sardines canned in oil.  If you smoke, quit Smoking lowers  HDL (good) cholesterol and raises triglycerides.  When you quit smoking your HDL (good) cholesterol increases and triglycerides decrease.  Stay active Exercise raises your HDL (good) cholesterol.  Walk, ride a bike, or swim for at least 20 minutes, 3-5 times per week.  Ideally, you should try to exercise for 30-60 minutes most days of the week.  Check  with your Healthcare Provider before beginning an exercise program.  You will receive a survey after today's visit either digitally by e-mail or paper by [employer]. Your experiences and feedback matter to [place].  Please respond so we know how we are doing as we provide care for you.  Call us with any questions/concerns/needs.  It is my goal to be available to you for your health concerns.  Thanks for choosing me to be a partner in your healthcare needs!  Harlin Rain, FNP-C Family Nurse Practitioner [facility] Phone: [phone]

## 2019-05-04 NOTE — Assessment & Plan Note (Signed)
Has long history of seasonal allergies and reports seasonal asthma, happening approximately 1-2 weeks out of the year along with season changes.    Plan: 1) Continue using Flonase daily 2) Start certirizine 10mg  daily for seasonal allergies 3) Will refill your Flovent to take with seasonal asthma with allergy change.  If you find that you are using this more than 1-2 weeks out of the year, we request to see you back in clinic sooner

## 2019-05-04 NOTE — Assessment & Plan Note (Signed)
Elevated labs.  Currently taking rosuvastatin 20mg , irregularly, and tolerating it well.   Plan: 1) Labs to be completed in 6 months 2) Continue rosuvastatin  3) Heart healthy diet and to exercise every other day for 30 minutes per day, going no more than 2 days in a row without exercise. 4) We will see you back in 6 months

## 2019-05-04 NOTE — Assessment & Plan Note (Signed)
Stable and well controlled depression.  Currently taking venlafaxine 75mg  daily, was taking 37.5mg  daily but has self increased her dosage, tolerating it well, managing stress better with this increase in medication.  Plan: 1) Continue venlafaxine 75mg  daily 2) Continue physical activity and healthy lifestyle 3) We will see you back in 6 months, sooner should the need arise

## 2019-05-04 NOTE — Progress Notes (Signed)
Virtual Visit via Telephone  The purpose of this virtual visit is to provide medical care while limiting exposure to the novel coronavirus (COVID19) for both patient and office staff.  Consent was obtained for phone visit:  Yes.   Answered questions that patient had about telehealth interaction:  Yes.   I discussed the limitations, risks, security and privacy concerns of performing an evaluation and management service by telephone. I also discussed with the patient that there may be a patient responsible charge related to this service. The patient expressed understanding and agreed to proceed.  Patient is at home and is accessed via telephone Services are provided by Harlin Rain, FNP-C from [facility])  ---------------------------------------------------------------------- Chief Complaint  Patient presents with  . Cough    sore throat, itchy eyes and coughing. The pt recently relocated and is surrounded by trees that she is allergic to. She admits that she was out this weekend and symptoms developed     S: Reviewed CMA documentation. I have called patient and gathered additional HPI as follows:  Ms. Flewellen reports having increased stress at work which has made her increase her venlafaxine from 37.5mg  daily to 75mg  daily.  Reports in the past she was utilizing as much as 150mg  daily but had weaned down to 37.5mg  daily.  Requesting to continue on the 75mg  daily.  Has a history of seasonal allergies and in the past has utilized Flovent once daily for a week at a time for seasonal asthma/allergies.  Has been using her flonase and requesting refill on Flovent and prescription for Zyrtec.  Has had a sore throat with itchy eyes and mild coughing, reports history of seasonal allergies and that this typically happens 1-2x per year.   Denies any high risk travel to areas of current concern for COVID19. Denies any known or suspected exposure to person with or possibly  with COVID19.  Denies any fevers, chills, sweats, body ache, shortness of breath, sinus pain or pressure, headache, abdominal pain, n/v/d, SI/HI  Past Medical History:  Diagnosis Date  . Allergy   . Depression   . Hypercholesterolemia    Social History   Tobacco Use  . Smoking status: Never Smoker  . Smokeless tobacco: Never Used  Substance Use Topics  . Alcohol use: No    Alcohol/week: 0.0 standard drinks  . Drug use: No    Current Outpatient Medications:  .  fluticasone (FLONASE) 50 MCG/ACT nasal spray, Place 1 spray into both nostrils as needed for allergies or rhinitis., Disp: , Rfl:  .  rosuvastatin (CRESTOR) 20 MG tablet, Take 1 tablet (20 mg total) by mouth daily., Disp: 90 tablet, Rfl: 1 .  cetirizine (ZYRTEC) 10 MG tablet, Take 1 tablet (10 mg total) by mouth daily., Disp: 30 tablet, Rfl: 0 .  Fluticasone Propionate, Inhal, (FLOVENT DISKUS) 100 MCG/BLIST AEPB, Inhale 2 puffs into the lungs daily., Disp: 60 each, Rfl: 0 .  venlafaxine XR (EFFEXOR XR) 75 MG 24 hr capsule, Take 1 capsule (75 mg total) by mouth daily with breakfast., Disp: 90 capsule, Rfl: 1  Depression screen [facility] 2/9 05/04/2019 03/28/2018 12/26/2017  Decreased Interest 1 0 0  Down, Depressed, Hopeless 1 0 0  PHQ - 2 Score 2 0 0  Altered sleeping 3 0 1  Tired, decreased energy 0 0 1  Change in appetite 0 0 0  Feeling bad or failure about yourself  0 2 0  Trouble concentrating 0 0 0  Moving slowly or fidgety/restless  0 0 0  Suicidal thoughts 0 0 0  PHQ-9 Score 5 2 2   Difficult doing work/chores Somewhat difficult Not difficult at all Somewhat difficult    GAD 7 : Generalized Anxiety Score 03/28/2018 12/26/2017 08/13/2017  Nervous, Anxious, on Edge 0 0 1  Control/stop worrying 0 0 0  Worry too much - different things 0 0 0  Trouble relaxing 1 1 1   Restless 0 0 1  Easily annoyed or irritable 0 0 0  Afraid - awful might happen 0 0 0  Total GAD 7 Score 1 1 3   Anxiety Difficulty Not difficult at all Not  difficult at all Somewhat difficult    -------------------------------------------------------------------------- O: No physical exam performed due to remote telephone encounter.  Physical Exam: Patient remotely monitored without video.  Verbal communication appropriate.  Cognition normal.  Lab results reviewed.   Recent Results (from the past 2160 hour(s))  Hemoglobin A1c     Status: None   Collection Time: 04/08/19  8:04 AM  Result Value Ref Range   Hgb A1c MFr Bld 5.4 <5.7 % of total Hgb    Comment: For the purpose of screening for the presence of diabetes: . <5.7%       Consistent with the absence of diabetes 5.7-6.4%    Consistent with increased risk for diabetes             (prediabetes) > or =6.5%  Consistent with diabetes . This assay result is consistent with a decreased risk of diabetes. . Currently, no consensus exists regarding use of hemoglobin A1c for diagnosis of diabetes in children. . According to American Diabetes Association (ADA) guidelines, hemoglobin A1c <7.0% represents optimal control in non-pregnant diabetic patients. Different metrics may apply to specific patient populations.  Standards of Medical Care in Diabetes(ADA). .    Mean Plasma Glucose 108 (calc)   eAG (mmol/L) 6.0 (calc)  Lipid panel     Status: Abnormal   Collection Time: 04/08/19  8:04 AM  Result Value Ref Range   Cholesterol 249 (H) <200 mg/dL   HDL 61 > OR = 50 mg/dL   Triglycerides 2161 (H) <150 mg/dL    Comment: . If a non-fasting specimen was collected, consider repeat triglyceride testing on a fasting specimen if clinically indicated.  04/10/19 et al. J. of Clin. Lipidol. 2015;9:129-169. 04/10/19    LDL Cholesterol (Calc) 154 (H) mg/dL (calc)    Comment: Reference range: <100 . Desirable range <100 mg/dL for primary prevention;   <70 mg/dL for patients with CHD or diabetic patients  with > or = 2 CHD risk factors. 992 LDL-C is now calculated using the Martin-Hopkins   calculation, which is a validated novel method providing  better accuracy than the Friedewald equation in the  estimation of LDL-C.  Perry Mount et al. Marland Kitchen. Marland Kitchen): 2061-2068  (http://education.[facility].com/faq/FAQ164)    Total CHOL/HDL Ratio 4.1 <5.0 (calc)   Non-HDL Cholesterol (Calc) 188 (H) <130 mg/dL (calc)    Comment: For patients with diabetes plus 1 major ASCVD risk  factor, treating to a non-HDL-C goal of <100 mg/dL  (LDL-C of Lenox Ahr mg/dL) is considered a therapeutic  option.   CBC with Differential/Platelet     Status: None   Collection Time: 04/08/19  8:04 AM  Result Value Ref Range   WBC 5.3 3.8 - 10.8 Thousand/uL   RBC 4.81 3.80 - 5.10 Million/uL   Hemoglobin 14.6 11.7 - 15.5 g/dL   HCT 10[birth year]-1970 <22 - 04/10/19 %   MCV 89.2 80.0 -  100.0 fL   MCH 30.4 27.0 - 33.0 pg   MCHC 34.0 32.0 - 36.0 g/dL   RDW 16.1 09.6 - 04.5 %   Platelets 226 140 - 400 Thousand/uL   MPV 9.3 7.5 - 12.5 fL   Neutro Abs 3,085 1,500 - 7,800 cells/uL   Lymphs Abs 1,574 850 - 3,900 cells/uL   Absolute Monocytes 440 200 - 950 cells/uL   Eosinophils Absolute 159 15 - 500 cells/uL   Basophils Absolute 42 0 - 200 cells/uL   Neutrophils Relative % 58.2 %   Total Lymphocyte 29.7 %   Monocytes Relative 8.3 %   Eosinophils Relative 3.0 %   Basophils Relative 0.8 %  Comprehensive metabolic panel     Status: None   Collection Time: 04/08/19  8:04 AM  Result Value Ref Range   Glucose, Bld 90 65 - 99 mg/dL    Comment: .            Fasting reference interval .    BUN 12 7 - 25 mg/dL   Creat 4.09 8.11 - 9.14 mg/dL    Comment: For patients >71 years of age, the reference limit for Creatinine is approximately 13% higher for people identified as African-American. .    BUN/Creatinine Ratio NOT APPLICABLE 6 - 22 (calc)   Sodium 141 135 - 146 mmol/L   Potassium 4.2 3.5 - 5.3 mmol/L   Chloride 103 98 - 110 mmol/L   CO2 31 20 - 32 mmol/L   Calcium 9.6 8.6 - 10.4 mg/dL   Total Protein 7.0 6.1 -  8.1 g/dL   Albumin 4.5 3.6 - 5.1 g/dL   Globulin 2.5 1.9 - 3.7 g/dL (calc)   AG Ratio 1.8 1.0 - 2.5 (calc)   Total Bilirubin 0.6 0.2 - 1.2 mg/dL   Alkaline phosphatase (APISO) 82 37 - 153 U/L   AST 20 10 - 35 U/L   ALT 23 6 - 29 U/L  TSH     Status: None   Collection Time: 04/08/19  8:04 AM  Result Value Ref Range   TSH 1.01 0.40 - 4.50 mIU/L    -------------------------------------------------------------------------- A&P:  Problem List Items Addressed This Visit      Respiratory   Allergic rhinitis    Has long history of seasonal allergies and reports seasonal asthma, happening approximately 1-2 weeks out of the year along with season changes.    Plan: 1) Continue using Flonase daily 2) Start certirizine 10mg  daily for seasonal allergies 3) Will refill your Flovent to take with seasonal asthma with allergy change.  If you find that you are using this more than 1-2 weeks out of the year, we request to see you back in clinic sooner       Relevant Medications   Fluticasone Propionate, Inhal, (FLOVENT DISKUS) 100 MCG/BLIST AEPB   cetirizine (ZYRTEC) 10 MG tablet     Other   Mixed hyperlipidemia    Elevated labs.  Currently taking rosuvastatin 20mg , irregularly, and tolerating it well.   Plan: 1) Labs to be completed in 6 months 2) Continue rosuvastatin  3) Heart healthy diet and to exercise every other day for 30 minutes per day, going no more than 2 days in a row without exercise. 4) We will see you back in 6 months       Relevant Medications   rosuvastatin (CRESTOR) 20 MG tablet   Mild major depression (HCC) - Primary    Stable and well controlled depression.  Currently taking venlafaxine  75mg  daily, was taking 37.5mg  daily but has self increased her dosage, tolerating it well, managing stress better with this increase in medication.  Plan: 1) Continue venlafaxine 75mg  daily 2) Continue physical activity and healthy lifestyle 3) We will see you back in 6 months,  sooner should the need arise       Relevant Medications   venlafaxine XR (EFFEXOR XR) 75 MG 24 hr capsule      Meds ordered this encounter  Medications  . DISCONTD: rosuvastatin (CRESTOR) 20 MG tablet    Sig: Take 1 tablet (20 mg total) by mouth daily.    Dispense:  30 tablet    Refill:  0  . DISCONTD: venlafaxine XR (EFFEXOR XR) 75 MG 24 hr capsule    Sig: Take 1 capsule (75 mg total) by mouth daily with breakfast.    Dispense:  30 capsule    Refill:  0  . Fluticasone Propionate, Inhal, (FLOVENT DISKUS) 100 MCG/BLIST AEPB    Sig: Inhale 2 puffs into the lungs daily.    Dispense:  60 each    Refill:  0  . venlafaxine XR (EFFEXOR XR) 75 MG 24 hr capsule    Sig: Take 1 capsule (75 mg total) by mouth daily with breakfast.    Dispense:  90 capsule    Refill:  1  . rosuvastatin (CRESTOR) 20 MG tablet    Sig: Take 1 tablet (20 mg total) by mouth daily.    Dispense:  90 tablet    Refill:  1  . cetirizine (ZYRTEC) 10 MG tablet    Sig: Take 1 tablet (10 mg total) by mouth daily.    Dispense:  30 tablet    Refill:  0    Follow-up: - Return in 6 months for repeat lipid labs  Patient verbalizes understanding with the above medical recommendations including the limitation of remote medical advice.  Specific follow-up and call-back criteria were given for patient to follow-up or seek medical care more urgently if needed.   - Time spent in direct consultation with patient on phone: 7 minutes  , FNP-C [facility] 05/04/2019, 2:02 PM

## 2019-05-07 ENCOUNTER — Ambulatory Visit: Payer: 59 | Admitting: Family Medicine

## 2019-05-26 ENCOUNTER — Other Ambulatory Visit: Payer: Self-pay | Admitting: Family Medicine

## 2019-05-26 DIAGNOSIS — J301 Allergic rhinitis due to pollen: Secondary | ICD-10-CM

## 2019-05-26 DIAGNOSIS — F320 Major depressive disorder, single episode, mild: Secondary | ICD-10-CM

## 2019-05-27 ENCOUNTER — Other Ambulatory Visit: Payer: Self-pay | Admitting: Family Medicine

## 2019-05-27 ENCOUNTER — Other Ambulatory Visit: Payer: Self-pay

## 2019-05-27 ENCOUNTER — Encounter: Payer: Self-pay | Admitting: Family Medicine

## 2019-05-27 ENCOUNTER — Ambulatory Visit (INDEPENDENT_AMBULATORY_CARE_PROVIDER_SITE_OTHER): Payer: 59 | Admitting: Family Medicine

## 2019-05-27 VITALS — BP 129/77 | HR 83 | Temp 97.8°F | Resp 16 | Ht 66.0 in | Wt 196.4 lb

## 2019-05-27 DIAGNOSIS — L237 Allergic contact dermatitis due to plants, except food: Secondary | ICD-10-CM | POA: Diagnosis not present

## 2019-05-27 DIAGNOSIS — E782 Mixed hyperlipidemia: Secondary | ICD-10-CM

## 2019-05-27 MED ORDER — HYDROXYZINE HCL 25 MG PO TABS: 12.5000 mg | ORAL_TABLET | Freq: Two times a day (BID) | ORAL | 0 refills | Status: DC | PRN

## 2019-05-27 MED ORDER — PREDNISONE 20 MG PO TABS: ORAL_TABLET | ORAL | 0 refills | Status: DC

## 2019-05-27 NOTE — Progress Notes (Signed)
Subjective:    Patient ID: Christy Watson, female    DOB: 10/28/1960, 59 y.o.   MRN: [id number]  Christy Watson is a 59 y.o. female presenting on 05/27/2019 for Poison Ivy (onset 3 days was doing yard work on weekend)  Patient presents for a same day appointment.  Patient's PCP is Cyndia Skeeters, FNP  HPI  Poison Ivy Dermatitis, Forearms Recent acute onset 3 days ago with poison ivy reaction. She admits working in yard and exposed at that time. Was unaware of exposure. Developed itching red rash later. Mostly R forearm some mild on left, trying to limit scratching. Some oozing still with red swollen and raised excoriations and some pain from arm swelling, but now today slightly improved Tried benadryl and topical hydrocortisone spray limited relief Worst flare in a while, known significant poison ivy allergy Denies any fevers chills sweats, skin sore, bleeding drainage of pus, spreading redness, difficulty breathing dyspnea nausea vomiting    Depression screen [facility] 2/9 05/04/2019 03/28/2018 12/26/2017  Decreased Interest 1 0 0  Down, Depressed, Hopeless 1 0 0  PHQ - 2 Score 2 0 0  Altered sleeping 3 0 1  Tired, decreased energy 0 0 1  Change in appetite 0 0 0  Feeling bad or failure about yourself  0 2 0  Trouble concentrating 0 0 0  Moving slowly or fidgety/restless 0 0 0  Suicidal thoughts 0 0 0  PHQ-9 Score 5 2 2   Difficult doing work/chores Somewhat difficult Not difficult at all Somewhat difficult    Social History   Tobacco Use  . Smoking status: Never Smoker  . Smokeless tobacco: Never Used  Substance Use Topics  . Alcohol use: No    Alcohol/week: 0.0 standard drinks  . Drug use: No    Review of Systems Per HPI unless specifically indicated above     Objective:    BP 129/77   Pulse 83   Temp 97.8 F (36.6 C) (Temporal)   Resp 16   Ht 5\' 6"  (1.676 m)   Wt 196 lb 6.4 oz (89.1 kg)   BMI 31.70 kg/m   Wt Readings from Last 3 Encounters:  05/27/19 196 lb  6.4 oz (89.1 kg)  05/04/19 184 lb (83.5 kg)  09/03/18 174 lb (78.9 kg)    Physical Exam Vitals and nursing note reviewed.  Constitutional:      General: She is not in acute distress.    Appearance: She is well-developed. She is not diaphoretic.     Comments: Well-appearing, comfortable, cooperative  HENT:     Head: Normocephalic and atraumatic.  Eyes:     General:        Right eye: No discharge.        Left eye: No discharge.     Conjunctiva/sclera: Conjunctivae normal.  Cardiovascular:     Rate and Rhythm: Normal rate.  Pulmonary:     Effort: Pulmonary effort is normal.  Skin:    General: Skin is warm and dry.     Findings: Rash (Right forearm extensive area of raised inflamed excoriated linear rash with some areas of skin sore with some slight oozing, general erythema surround lesions and some slight swelling. No ulceration.) present. No erythema.  Neurological:     Mental Status: She is alert and oriented to person, place, and time.  Psychiatric:        Behavior: Behavior normal.     Comments: Well groomed, good eye contact, normal speech and thoughts  Results for orders placed or performed in visit on 04/08/19  Hemoglobin A1c  Result Value Ref Range   Hgb A1c MFr Bld 5.4 <5.7 % of total Hgb   Mean Plasma Glucose 108 (calc)   eAG (mmol/L) 6.0 (calc)  Lipid panel  Result Value Ref Range   Cholesterol 249 (H) <200 mg/dL   HDL 61 > OR = 50 mg/dL   Triglycerides 161 (H) <150 mg/dL   LDL Cholesterol (Calc) 154 (H) mg/dL (calc)   Total CHOL/HDL Ratio 4.1 <5.0 (calc)   Non-HDL Cholesterol (Calc) 188 (H) <130 mg/dL (calc)  CBC with Differential/Platelet  Result Value Ref Range   WBC 5.3 3.8 - 10.8 Thousand/uL   RBC 4.81 3.80 - 5.10 Million/uL   Hemoglobin 14.6 11.7 - 15.5 g/dL   HCT 09.6 04.5 - 40.9 %   MCV 89.2 80.0 - 100.0 fL   MCH 30.4 27.0 - 33.0 pg   MCHC 34.0 32.0 - 36.0 g/dL   RDW 81.1 91.4 - 78.2 %   Platelets 226 140 - 400 Thousand/uL   MPV 9.3 7.5 -  12.5 fL   Neutro Abs 3,085 1,500 - 7,800 cells/uL   Lymphs Abs 1,574 850 - 3,900 cells/uL   Absolute Monocytes 440 200 - 950 cells/uL   Eosinophils Absolute 159 15 - 500 cells/uL   Basophils Absolute 42 0 - 200 cells/uL   Neutrophils Relative % 58.2 %   Total Lymphocyte 29.7 %   Monocytes Relative 8.3 %   Eosinophils Relative 3.0 %   Basophils Relative 0.8 %  Comprehensive metabolic panel  Result Value Ref Range   Glucose, Bld 90 65 - 99 mg/dL   BUN 12 7 - 25 mg/dL   Creat 9.56 2.13 - 0.86 mg/dL   BUN/Creatinine Ratio NOT APPLICABLE 6 - 22 (calc)   Sodium 141 135 - 146 mmol/L   Potassium 4.2 3.5 - 5.3 mmol/L   Chloride 103 98 - 110 mmol/L   CO2 31 20 - 32 mmol/L   Calcium 9.6 8.6 - 10.4 mg/dL   Total Protein 7.0 6.1 - 8.1 g/dL   Albumin 4.5 3.6 - 5.1 g/dL   Globulin 2.5 1.9 - 3.7 g/dL (calc)   AG Ratio 1.8 1.0 - 2.5 (calc)   Total Bilirubin 0.6 0.2 - 1.2 mg/dL   Alkaline phosphatase (APISO) 82 37 - 153 U/L   AST 20 10 - 35 U/L   ALT 23 6 - 29 U/L  TSH  Result Value Ref Range   TSH 1.01 0.40 - 4.50 mIU/L      Assessment & Plan:   Problem List Items Addressed This Visit    None    Visit Diagnoses    Poison ivy dermatitis    -  Primary   Relevant Medications   predniSONE (DELTASONE) 20 MG tablet   hydrOXYzine (ATARAX/VISTARIL) 25 MG tablet      Consistent with poison ivy contact dermatitis, x 3 day duration. Patient with known h/o poison ivy, similar with previous exposures - Afebrile, no evidence of bacterial superinfection from scratching  Plan: 1. Prednisone 20mg  tabs - 14 day course with taper, 40mg  daily x 7 days, then 20mg  daily x 7 days 2.Start Hydroxyzine 12.5-25mg  BID PRN for itching primarily night time, caution sedation, stop benadryl - Reviewed precautions, topical remedy, AVS instructions 3. Avoid scratching 4. Return criteria given, 2 weeks if worsening or not improving   Meds ordered this encounter  Medications  . predniSONE (DELTASONE) 20 MG  tablet  Sig: Take 2 tablets daily (40mg ) for 7 days, then take 1 tab daily (20mg ) for 7 days.    Dispense:  21 tablet    Refill:  0  . hydrOXYzine (ATARAX/VISTARIL) 25 MG tablet    Sig: Take 0.5-1 tablets (12.5-25 mg total) by mouth 2 (two) times daily as needed for itching.    Dispense:  30 tablet    Refill:  0      Follow up plan: Return in about 1 week (around 06/03/2019), or if symptoms worsen or fail to improve, for poison ivy.   03-14-1982, DO [facility] 05/27/2019, 4:49 PM

## 2019-05-27 NOTE — Patient Instructions (Addendum)
Thank you for coming to the office today.  It looks like you do have Poison Ivy / Oak Recommend to use topical treatments with Calamine, (may try oatmeal bath), and may take Benadryl at night as needed for itching and sleep.  Start Prednisone (antiinflammatory steroid) - take 2 tablets (40mg ) daily with breakfast for 7 days, then take 1 tablet (20mg ) for 7 days. Complete entire course, even if feeling better.  If symptoms get worse or do not resolve at all within 2 weeks, please return for re-evaluation. Otherwise, if improved but start to come back, you may call for refill on Prednisone - if develop swelling, redness, fever, and warmth, drainage of pus concern for infection please return sooner. - Try your best to avoid scratching (reduce chances of infection)  In the future, to help prevent poison ivy skin rash it is recommended to thoroughly wash potentially exposed areas with soap or even liquid dish soap (if it is hand safe) within 30 minutes of initial exposure. You can reduce risk of flare up if you wash within 1 to 3 hours, but if you wait more than 3 hours, high likelihood of developing the rash.   Please schedule a Follow-up Appointment to: Return in about 1 week (around 06/03/2019), or if symptoms worsen or fail to improve, for poison ivy.  If you have any other questions or concerns, please feel free to call the office or send a message through MyChart. You may also schedule an earlier appointment if necessary.  Additionally, you may be receiving a survey about your experience at our office within a few days to 1 week by e-mail or mail. We value your feedback.  , DO [facility], Saralyn Pilar

## 2019-06-01 ENCOUNTER — Other Ambulatory Visit: Payer: Self-pay | Admitting: Family Medicine

## 2019-06-01 DIAGNOSIS — J301 Allergic rhinitis due to pollen: Secondary | ICD-10-CM

## 2019-06-01 NOTE — Telephone Encounter (Signed)
Requested medication (s) are due for refill today: yes  Requested medication (s) are on the active medication list: yes  Last refill:  05/05/19  Future visit scheduled: yes  Notes to clinic:  historical provider    Requested Prescriptions  Pending Prescriptions Disp Refills   FLOVENT DISKUS 100 MCG/BLIST AEPB [Pharmacy Med Name: FLOVENT 100 MCG DISKUS] 60 each 0    Sig: INHALE 2 PUFFS BY MOUTH INTO THE LUNGS DAILY      Pulmonology:  Corticosteroids Passed - 06/01/2019  1:36 AM      Passed - Valid encounter within last 12 months    Recent Outpatient Visits           5 days ago Poison ivy dermatitis   [facility], Netta Neat, DO   4 weeks ago Mild major depression [facility])   [facility], Jodelle Gross, FNP   1 year ago Gastroesophageal reflux disease without esophagitis   [facility], Alison Stalling, NP   1 year ago Anxiety   [facility] Ky[facility] Rudd, Alison Stalling, NP   1 year ago Anxiety   [facility], Alison Stalling, NP

## 2019-06-02 NOTE — Telephone Encounter (Addendum)
I called the patient to find out if she truly need a refill on her flovent or if it is just a auto refill request. No answer, lmom to return my call. Christy Watson documented that she refilled your Flovent to take with seasonal asthma with allergy change.  If you find that you are using this more than 1-2 weeks out of the year, we request to see you back in clinic sooner.

## 2019-06-19 ENCOUNTER — Other Ambulatory Visit: Payer: Self-pay | Admitting: Family Medicine

## 2019-06-19 DIAGNOSIS — J301 Allergic rhinitis due to pollen: Secondary | ICD-10-CM

## 2019-06-19 DIAGNOSIS — F320 Major depressive disorder, single episode, mild: Secondary | ICD-10-CM

## 2020-01-16 ENCOUNTER — Other Ambulatory Visit: Payer: Self-pay | Admitting: Family Medicine

## 2020-01-16 DIAGNOSIS — F320 Major depressive disorder, single episode, mild: Secondary | ICD-10-CM

## 2020-01-17 NOTE — Telephone Encounter (Signed)
Called pt and informed her that she needs appt for med refill- pt stated she will call back this week to make appt. 30 day courtesy RF given. Requested Prescriptions  Pending Prescriptions Disp Refills  . venlafaxine XR (EFFEXOR-XR) 75 MG 24 hr capsule [Pharmacy Med Name: Venlafaxine HCl ER 75 MG Oral Capsule Extended Release 24 Hour] 30 capsule 0    Sig: TAKE 1 CAPSULE BY MOUTH  DAILY WITH BREAKFAST     Psychiatry: Antidepressants - SNRI - desvenlafaxine & venlafaxine Failed - 01/16/2020  9:37 PM      Failed - LDL in normal range and within 360 days    LDL Cholesterol (Calc)  Date Value Ref Range Status  04/08/2019 154 (H) mg/dL (calc) Final    Comment:    Reference range: <100 . Desirable range <100 mg/dL for primary prevention;   <70 mg/dL for patients with CHD or diabetic patients  with > or = 2 CHD risk factors. Marland Kitchen LDL-C is now calculated using the Martin-Hopkins  calculation, which is a validated novel method providing  better accuracy than the Friedewald equation in the  estimation of LDL-C.  Horald Pollen et al. Lenox Ahr. 3159;458(59): 2061-2068  (http://education.[facility].com/faq/FAQ164)          Failed - Total Cholesterol in normal range and within 360 days    Cholesterol  Date Value Ref Range Status  04/08/2019 249 (H) <200 mg/dL Final         Failed - Triglycerides in normal range and within 360 days    Triglycerides  Date Value Ref Range Status  04/08/2019 200 (H) <150 mg/dL Final    Comment:    . If a non-fasting specimen was collected, consider repeat triglyceride testing on a fasting specimen if clinically indicated.  Perry Mount et al. J. of Clin. Lipidol. 2015;9:129-169. .          Failed - Valid encounter within last 6 months    Recent Outpatient Visits          7 months ago Poison ivy dermatitis   [facility], Netta Neat, DO   8 months ago Mild major depression [facility])   [facility], Jodelle Gross, FNP    1 year ago Gastroesophageal reflux disease without esophagitis   [facility], Alison Stalling, NP   2 years ago Anxiety   [facility] Ky[facility] Rudd, Alison Stalling, NP   2 years ago Anxiety   [facility] Kyung Rudd, Alison Stalling, NP             Passed - Completed PHQ-2 or PHQ-9 in the last 360 days      Passed - Last BP in normal range    BP Readings from Last 1 Encounters:  05/27/19 129/77

## 2020-01-21 ENCOUNTER — Encounter: Payer: Self-pay | Admitting: Family Medicine

## 2020-01-21 ENCOUNTER — Other Ambulatory Visit: Payer: Self-pay

## 2020-01-21 ENCOUNTER — Ambulatory Visit (INDEPENDENT_AMBULATORY_CARE_PROVIDER_SITE_OTHER): Payer: 59 | Admitting: Family Medicine

## 2020-01-21 VITALS — BP 137/68 | HR 84 | Resp 17 | Ht 66.0 in | Wt 205.2 lb

## 2020-01-21 DIAGNOSIS — K219 Gastro-esophageal reflux disease without esophagitis: Secondary | ICD-10-CM | POA: Diagnosis not present

## 2020-01-21 DIAGNOSIS — F419 Anxiety disorder, unspecified: Secondary | ICD-10-CM

## 2020-01-21 DIAGNOSIS — F320 Major depressive disorder, single episode, mild: Secondary | ICD-10-CM

## 2020-01-21 MED ORDER — BUSPIRONE HCL 10 MG PO TABS: 10.0000 mg | ORAL_TABLET | Freq: Two times a day (BID) | ORAL | 1 refills | Status: DC

## 2020-01-21 MED ORDER — VENLAFAXINE HCL ER 75 MG PO CP24: ORAL_CAPSULE | ORAL | 3 refills | Status: DC

## 2020-01-21 MED ORDER — OMEPRAZOLE 20 MG PO CPDR: 20.0000 mg | DELAYED_RELEASE_CAPSULE | Freq: Every day | ORAL | 0 refills | Status: DC

## 2020-01-21 MED ORDER — OMEPRAZOLE 20 MG PO CPDR: 20.0000 mg | DELAYED_RELEASE_CAPSULE | Freq: Every day | ORAL | 1 refills | Status: DC

## 2020-01-21 NOTE — Assessment & Plan Note (Signed)
See Depression A&P.

## 2020-01-21 NOTE — Progress Notes (Signed)
Subjective:    Patient ID: Christy Watson, female    DOB: 1961-01-20, 59 y.o.   MRN: [id number]  Christy Watson is a 59 y.o. female presenting on 01/21/2020 for Depression (Pt her f/u on her mild depression, medication refills )   HPI  Christy Watson presents to clinic for follow up on her anxiety and depression.  Reports lessened control, has been having increased stressors that have been exacerbating her anxiety/depression.  Has her first visit with a counselor/therapist this evening.  Is interested in   Depression screen [facility] 2/9 01/21/2020 05/04/2019 03/28/2018  Decreased Interest 1 1 0  Down, Depressed, Hopeless 1 1 0  PHQ - 2 Score 2 2 0  Altered sleeping 2 3 0  Tired, decreased energy 1 0 0  Change in appetite 3 0 0  Feeling bad or failure about yourself  0 0 2  Trouble concentrating 0 0 0  Moving slowly or fidgety/restless 0 0 0  Suicidal thoughts 0 0 0  PHQ-9 Score 8 5 2   Difficult doing work/chores Somewhat difficult Somewhat difficult Not difficult at all    Social History   Tobacco Use  . Smoking status: Never Smoker  . Smokeless tobacco: Never Used  Vaping Use  . Vaping Use: Never used  Substance Use Topics  . Alcohol use: No    Alcohol/week: 0.0 standard drinks  . Drug use: No    Review of Systems  Constitutional: Negative.   HENT: Negative.   Eyes: Negative.   Respiratory: Negative.   Cardiovascular: Negative.   Gastrointestinal: Negative.   Endocrine: Negative.   Genitourinary: Negative.   Musculoskeletal: Negative.   Skin: Negative.   Allergic/Immunologic: Negative.   Neurological: Negative.   Hematological: Negative.   Psychiatric/Behavioral: Positive for dysphoric mood. Negative for agitation, behavioral problems, confusion, decreased concentration, hallucinations, self-injury, sleep disturbance and suicidal ideas. The patient is nervous/anxious. The patient is not hyperactive.    Per HPI unless specifically indicated above     Objective:     BP 137/68 (BP Location: Left Arm, Patient Position: Sitting, Cuff Size: Normal)   Pulse 84   Resp 17   Ht 5\' 6"  (1.676 m)   Wt 205 lb 3.2 oz (93.1 kg)   SpO2 100%   BMI 33.12 kg/m   Wt Readings from Last 3 Encounters:  01/21/20 205 lb 3.2 oz (93.1 kg)  05/27/19 196 lb 6.4 oz (89.1 kg)  05/04/19 184 lb (83.5 kg)    Physical Exam Vitals and nursing note reviewed.  Constitutional:      General: She is not in acute distress.    Appearance: Normal appearance. She is well-developed and well-groomed. She is obese. She is not ill-appearing or toxic-appearing.  HENT:     Head: Normocephalic and atraumatic.     Nose:     Comments: 05/29/19 is in place, covering mouth and nose. Eyes:     General: Lids are normal. Vision grossly intact.        Right eye: No discharge.        Left eye: No discharge.     Extraocular Movements: Extraocular movements intact.     Conjunctiva/sclera: Conjunctivae normal.     Pupils: Pupils are equal, round, and reactive to light.  Cardiovascular:     Rate and Rhythm: Normal rate and regular rhythm.     Pulses: Normal pulses.     Heart sounds: Normal heart sounds. No murmur heard. No friction rub. No gallop.   Pulmonary:  Effort: Pulmonary effort is normal. No respiratory distress.     Breath sounds: Normal breath sounds.  Skin:    General: Skin is warm and dry.     Capillary Refill: Capillary refill takes less than 2 seconds.  Neurological:     General: No focal deficit present.     Mental Status: She is alert and oriented to person, place, and time.  Psychiatric:        Attention and Perception: Attention and perception normal.        Mood and Affect: Mood and affect normal.        Speech: Speech normal.        Behavior: Behavior normal. Behavior is cooperative.        Thought Content: Thought content normal.        Cognition and Memory: Cognition and memory normal.        Judgment: Judgment normal.    Results for orders placed or performed  in visit on 04/08/19  Hemoglobin A1c  Result Value Ref Range   Hgb A1c MFr Bld 5.4 <5.7 % of total Hgb   Mean Plasma Glucose 108 (calc)   eAG (mmol/L) 6.0 (calc)  Lipid panel  Result Value Ref Range   Cholesterol 249 (H) <200 mg/dL   HDL 61 > OR = 50 mg/dL   Triglycerides 476 (H) <150 mg/dL   LDL Cholesterol (Calc) 154 (H) mg/dL (calc)   Total CHOL/HDL Ratio 4.1 <5.0 (calc)   Non-HDL Cholesterol (Calc) 188 (H) <130 mg/dL (calc)  CBC with Differential/Platelet  Result Value Ref Range   WBC 5.3 3.8 - 10.8 Thousand/uL   RBC 4.81 3.80 - 5.10 Million/uL   Hemoglobin 14.6 11.7 - 15.5 g/dL   HCT 54.6 50.3 - 54.6 %   MCV 89.2 80.0 - 100.0 fL   MCH 30.4 27.0 - 33.0 pg   MCHC 34.0 32.0 - 36.0 g/dL   RDW 56.8 12.7 - 51.7 %   Platelets 226 140 - 400 Thousand/uL   MPV 9.3 7.5 - 12.5 fL   Neutro Abs 3,085 1,500 - 7,800 cells/uL   Lymphs Abs 1,574 850 - 3,900 cells/uL   Absolute Monocytes 440 200 - 950 cells/uL   Eosinophils Absolute 159 15 - 500 cells/uL   Basophils Absolute 42 0 - 200 cells/uL   Neutrophils Relative % 58.2 %   Total Lymphocyte 29.7 %   Monocytes Relative 8.3 %   Eosinophils Relative 3.0 %   Basophils Relative 0.8 %  Comprehensive metabolic panel  Result Value Ref Range   Glucose, Bld 90 65 - 99 mg/dL   BUN 12 7 - 25 mg/dL   Creat 0.01 7.49 - 4.49 mg/dL   BUN/Creatinine Ratio NOT APPLICABLE 6 - 22 (calc)   Sodium 141 135 - 146 mmol/L   Potassium 4.2 3.5 - 5.3 mmol/L   Chloride 103 98 - 110 mmol/L   CO2 31 20 - 32 mmol/L   Calcium 9.6 8.6 - 10.4 mg/dL   Total Protein 7.0 6.1 - 8.1 g/dL   Albumin 4.5 3.6 - 5.1 g/dL   Globulin 2.5 1.9 - 3.7 g/dL (calc)   AG Ratio 1.8 1.0 - 2.5 (calc)   Total Bilirubin 0.6 0.2 - 1.2 mg/dL   Alkaline phosphatase (APISO) 82 37 - 153 U/L   AST 20 10 - 35 U/L   ALT 23 6 - 29 U/L  TSH  Result Value Ref Range   TSH 1.01 0.40 - 4.50 mIU/L  Assessment & Plan:   Problem List Items Addressed This Visit      Digestive    Gastroesophageal reflux disease    Reports worsening GERD, has been on omeprazole in the past and is interested in restarting on this medication.  Reports previously well controlled.  Likely exacerbated by uncontrolled anxiety at the current moment but will restart on omeprazole 20mg  daily.  To RTC in 3 months, sooner, if needed      Relevant Medications   omeprazole (PRILOSEC) 20 MG capsule     Other   Mild major depression (HCC)    PHQ9-8/GAD7-9.  Reports feels that her anxiety/depression are not that well managed, has been having some increased stressors at home, is starting her first visit with a counselor this evening.  Is interested in starting on additional medication, discussed buspar and will send in trial on this.  Provided with resources for both online counseling and in office locations that are local should she not find the right fit with her counselor/therapist this evening.  To RTC in 3-6 months.      Relevant Medications   busPIRone (BUSPAR) 10 MG tablet   venlafaxine XR (EFFEXOR-XR) 75 MG 24 hr capsule   Anxiety - Primary    See Depression AP      Relevant Medications   busPIRone (BUSPAR) 10 MG tablet   venlafaxine XR (EFFEXOR-XR) 75 MG 24 hr capsule      Meds ordered this encounter  Medications  . busPIRone (BUSPAR) 10 MG tablet    Sig: Take 1 tablet (10 mg total) by mouth 2 (two) times daily.    Dispense:  180 tablet    Refill:  1  . venlafaxine XR (EFFEXOR-XR) 75 MG 24 hr capsule    Sig: TAKE 1 CAPSULE BY MOUTH  DAILY WITH BREAKFAST    Dispense:  90 capsule    Refill:  3  . DISCONTD: omeprazole (PRILOSEC) 20 MG capsule    Sig: Take 1 capsule (20 mg total) by mouth daily.    Dispense:  90 capsule    Refill:  1  . omeprazole (PRILOSEC) 20 MG capsule    Sig: Take 1 capsule (20 mg total) by mouth daily.    Dispense:  30 capsule    Refill:  0    Follow up plan: Return in about 3 months (around 04/20/2020) for Depression F/U.   06/20/2020,  FNP Family Nurse Practitioner [facility] 01/21/2020, 12:58 PM

## 2020-01-21 NOTE — Patient Instructions (Addendum)
Please contact one of these locations below to schedule an appointment for psychiatric care  [facility] in [place] or [place] and they have late night and weekend hours  [facility] [address] [place], [place] [address] Phone# [phone]  [facility] [address] [place], [place] [address] Phone# [phone]  [facility] [address] [place], [place] [address] Phone# [phone]  [facility] [address] [place], [place] [address] Phone# [phone]   Can look into [employer]Space, BetterHelp and [employer] apps  We will plan to see you back in 3 months for depression/anxiety follow up  You will receive a survey after today's visit either digitally by e-mail or paper by USPS mail. Your experiences and feedback matter to [place].  Please respond so we know how we are doing as we provide care for you.  Call us with any questions/concerns/needs.  It is my goal to be available to you for your health concerns.  Thanks for choosing me to be a partner in your healthcare needs!  Charlaine Dalton, FNP-C Family Nurse Practitioner [facility] Phone: [phone]

## 2020-01-21 NOTE — Assessment & Plan Note (Signed)
PHQ9-8/GAD7-9.  Reports feels that her anxiety/depression are not that well managed, has been having some increased stressors at home, is starting her first visit with a counselor this evening.  Is interested in starting on additional medication, discussed buspar and will send in trial on this.  Provided with resources for both online counseling and in office locations that are local should she not find the right fit with her counselor/therapist this evening.  To RTC in 3-6 months.

## 2020-01-21 NOTE — Assessment & Plan Note (Signed)
Reports worsening GERD, has been on omeprazole in the past and is interested in restarting on this medication.  Reports previously well controlled.  Likely exacerbated by uncontrolled anxiety at the current moment but will restart on omeprazole 20mg  daily.  To RTC in 3 months, sooner, if needed

## 2020-01-22 ENCOUNTER — Ambulatory Visit: Payer: Self-pay | Admitting: *Deleted

## 2020-01-22 NOTE — Telephone Encounter (Signed)
Can stop the medication.  Will defer her anxiety management to psychiatric provider.  Has she established with a psych provider?  I provided her with a list of local offices to contact.

## 2020-01-22 NOTE — Telephone Encounter (Signed)
The pt was notified of Joni Reining recommendation. She verbalize understanding, no questions or concerns.

## 2020-01-22 NOTE — Telephone Encounter (Signed)
Pt was prescribed Buspar yesterday and not tolerating it very well.  I let pt know I would send a note to Danielle Rankin, FNP letting her know she is not tolerating the Buspar.    Someone from the office will give you a call back.  Notes sent to [facility] for [facility].   Reason for Disposition . [1] Caller has URGENT medicine question about med that PCP or specialist prescribed AND [2] triager unable to answer question    Buspar causing nausea and a headache  Answer Assessment - Initial Assessment Questions 1. NAME of MEDICATION: "What medicine are you calling about?"     Buspar started yesterday and now nausea and headache.   2. QUESTION: "What is your question?" (e.g., medication refill, side effect)   "I'm not tolerating this medication very well".     She took the first dose yesterday and again this morning.   The Buspar was prescribed for her yesterday during her office visit. 3. PRESCRIBING HCP: "Who prescribed it?" Reason: if prescribed by specialist, call should be referred to that group.     Danielle Rankin, FNP 4. SYMPTOMS: "Do you have any symptoms?"     Nausea and a headache 5. SEVERITY: If symptoms are present, ask "Are they mild, moderate or severe?"     moderate 6. PREGNANCY:  "Is there any chance that you are pregnant?" "When was your last menstrual period?"     Not asked  Protocols used: MEDICATION QUESTION CALL-A-AH

## 2020-02-15 ENCOUNTER — Ambulatory Visit: Payer: Self-pay | Admitting: *Deleted

## 2020-02-15 NOTE — Telephone Encounter (Signed)
Patient calling after receiving positive COVID home test last night. Patient states that symptoms of sore throat, headache and feeling tired started on Saturday.Patient advised to use over the counter medications to treat symptoms. Patient given care advice and isolation instructions. Understanding verbalized.  Reason for Disposition . [1] [id number] diagnosed by positive lab test (e.g., PCR, rapid self-test kit) AND [2] mild symptoms (e.g., cough, fever, others) AND [0] no complications or SOB  Answer Assessment - Initial Assessment Questions 1. COVID-19 DIAGNOSIS: "Who made your COVID-19 diagnosis?" "Was it confirmed by a positive lab test?" If not diagnosed by a HCP, ask "Are there lots of cases (community spread) where you live?" Note: See public health department website, if unsure.     Patient took home test on yesterday 2. COVID-19 EXPOSURE: "Was there any known exposure to COVID before the symptoms began?" CDC Definition of close contact: within 6 feet (2 meters) for a total of 15 minutes or more over a 24-hour period.      unknown 3. ONSET: "When did the COVID-19 symptoms start?"      Symptoms started on Saturday 4. WORST SYMPTOM: "What is your worst symptom?" (e.g., cough, fever, shortness of breath, muscle aches)     Patient reports that she has been feeling tired a lot and has a sore throat and headache 5. COUGH: "Do you have a cough?" If Yes, ask: "How bad is the cough?"       Did not report a cough 6. FEVER: "Do you have a fever?" If Yes, ask: "What is your temperature, how was it measured, and when did it start?"     Did not report a fever 7. RESPIRATORY STATUS: "Describe your breathing?" (e.g., shortness of breath, wheezing, unable to speak)      Did not voice difficulty breathing 8. BETTER-SAME-WORSE: "Are you getting better, staying the same or getting worse compared to yesterday?"  If getting worse, ask, "In what way?"     Same 9. OTHER SYMPTOMS: "Do you have any other  symptoms?"  (e.g., chills, fatigue, headache, loss of smell or taste, muscle pain, sore throat; new loss of smell or taste especially support the diagnosis of COVID-19)       Headache, sore throat, fatigue  Protocols used: CORONAVIRUS (COVID-19) DIAGNOSED OR SUSPECTED-A-AH

## 2020-03-11 ENCOUNTER — Ambulatory Visit: Payer: Self-pay

## 2020-03-11 NOTE — Telephone Encounter (Signed)
Patient called stating that she has pain rt ear.  She states it is a stabbing pain that comes and goes.  She has had recent COVID-19.   She rates that pain at 4 up to 8-9 at times.  She states that this pain started about 1 week ago.She has no sinus issues She has no fever or cough. Per protocol patient will go to [facility] for evaluation today.  She was told to call back for follow up in office.  She verbalized understanding and will follow plan of care.  Reason for Disposition . Earache  (Exceptions: brief ear pain of < 60 minutes duration, earache occurring during air travel  Answer Assessment - Initial Assessment Questions 1. LOCATION: "Which ear is involved?"     Rt ear pain 2. ONSET: "When did the ear start hurting"      1week ago 3. SEVERITY: "How bad is the pain?"  (Scale 1-10; mild, moderate or severe)   - MILD (1-3): doesn't interfere with normal activities    - MODERATE (4-7): interferes with normal activities or awakens from sleep    - SEVERE (8-10): excruciating pain, unable to do any normal activities      4 up to 8-9 4. URI SYMPTOMS: "Do you have a runny nose or cough?"     none 5. FEVER: "Do you have a fever?" If Yes, ask: "What is your temperature, how was it measured, and when did it start?"     none 6. CAUSE: "Have you been swimming recently?", "How often do you use Q-TIPS?", "Have you had any recent air travel or scuba diving?"   Uses Q tip to itch around outside edge 7. OTHER SYMPTOMS: "Do you have any other symptoms?" (e.g., headache, stiff neck, dizziness, vomiting, runny nose, decreased hearing)    stress in shoulders seeing ciriopractor 8. PREGNANCY: "Is there any chance you are pregnant?" "When was your last menstrual period?"     No  Protocols used: EARACHE-A-AH

## 2020-03-21 ENCOUNTER — Other Ambulatory Visit: Payer: Self-pay

## 2020-03-21 DIAGNOSIS — E782 Mixed hyperlipidemia: Secondary | ICD-10-CM

## 2020-03-21 MED ORDER — ROSUVASTATIN CALCIUM 20 MG PO TABS: 20.0000 mg | ORAL_TABLET | Freq: Every day | ORAL | 1 refills | Status: DC

## 2020-04-17 ENCOUNTER — Emergency Department
Admission: EM | Admit: 2020-04-17 | Discharge: 2020-04-17 | Disposition: A | Discharge status: Home / Self Care | Payer: 59 | Attending: Emergency Medicine | Admitting: Emergency Medicine

## 2020-04-17 ENCOUNTER — Emergency Department: Payer: 59

## 2020-04-17 ENCOUNTER — Other Ambulatory Visit: Payer: Self-pay

## 2020-04-17 DIAGNOSIS — S0101XA Laceration without foreign body of scalp, initial encounter: Secondary | ICD-10-CM | POA: Diagnosis not present

## 2020-04-17 DIAGNOSIS — S0990XA Unspecified injury of head, initial encounter: Secondary | ICD-10-CM

## 2020-04-17 DIAGNOSIS — W278XXA Contact with other nonpowered hand tool, initial encounter: Secondary | ICD-10-CM | POA: Diagnosis not present

## 2020-04-17 DIAGNOSIS — I10 Essential (primary) hypertension: Secondary | ICD-10-CM | POA: Insufficient documentation

## 2020-04-17 DIAGNOSIS — Z23 Encounter for immunization: Secondary | ICD-10-CM | POA: Insufficient documentation

## 2020-04-17 MED ORDER — LIDOCAINE-EPINEPHRINE-TETRACAINE (LET) TOPICAL GEL
3.0000 mL | Freq: Once | TOPICAL | Status: AC
  Administered 2020-04-17: 3 mL via TOPICAL
  Filled 2020-04-17: qty 3

## 2020-04-17 MED ORDER — TETANUS-DIPHTH-ACELL PERTUSSIS 5-2.5-18.5 LF-MCG/0.5 IM SUSY
0.5000 mL | PREFILLED_SYRINGE | Freq: Once | INTRAMUSCULAR | Status: AC
  Administered 2020-04-17: 0.5 mL via INTRAMUSCULAR
  Filled 2020-04-17: qty 0.5

## 2020-04-17 NOTE — Discharge Instructions (Signed)
Have staples removed in 5-7 days. Return to ER for any abnormal drainage, fevers, or other concerns.
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
## 2020-04-20 ENCOUNTER — Other Ambulatory Visit: Payer: Self-pay

## 2020-04-20 DIAGNOSIS — F419 Anxiety disorder, unspecified: Secondary | ICD-10-CM

## 2020-04-20 MED ORDER — BUSPIRONE HCL 10 MG PO TABS: 10.0000 mg | ORAL_TABLET | Freq: Two times a day (BID) | ORAL | 1 refills | Status: DC

## 2020-04-22 ENCOUNTER — Ambulatory Visit (INDEPENDENT_AMBULATORY_CARE_PROVIDER_SITE_OTHER): Payer: 59 | Admitting: Family Medicine

## 2020-04-22 ENCOUNTER — Other Ambulatory Visit: Payer: Self-pay

## 2020-04-22 ENCOUNTER — Other Ambulatory Visit: Payer: Self-pay | Admitting: Family Medicine

## 2020-04-22 ENCOUNTER — Encounter: Payer: Self-pay | Admitting: Family Medicine

## 2020-04-22 VITALS — BP 121/70 | HR 82 | Ht 66.0 in | Wt 192.4 lb

## 2020-04-22 DIAGNOSIS — K219 Gastro-esophageal reflux disease without esophagitis: Secondary | ICD-10-CM

## 2020-04-22 DIAGNOSIS — S0101XA Laceration without foreign body of scalp, initial encounter: Secondary | ICD-10-CM

## 2020-04-22 DIAGNOSIS — F419 Anxiety disorder, unspecified: Secondary | ICD-10-CM

## 2020-04-22 DIAGNOSIS — R519 Headache, unspecified: Secondary | ICD-10-CM | POA: Diagnosis not present

## 2020-04-22 DIAGNOSIS — E782 Mixed hyperlipidemia: Secondary | ICD-10-CM

## 2020-04-22 DIAGNOSIS — F320 Major depressive disorder, single episode, mild: Secondary | ICD-10-CM

## 2020-04-22 MED ORDER — BUSPIRONE HCL 10 MG PO TABS: 10.0000 mg | ORAL_TABLET | Freq: Two times a day (BID) | ORAL | 1 refills | Status: DC

## 2020-04-22 MED ORDER — VENLAFAXINE HCL ER 75 MG PO CP24: ORAL_CAPSULE | ORAL | 1 refills | Status: DC

## 2020-04-22 MED ORDER — OMEPRAZOLE 20 MG PO CPDR: 20.0000 mg | DELAYED_RELEASE_CAPSULE | Freq: Every day | ORAL | 1 refills | Status: DC

## 2020-04-22 NOTE — Patient Instructions (Addendum)
Thank you for coming to the office today.  Staples are out. Looks great.  Gentle washing still for now. Still has the central scab in tact, some localized swelling. But it is healing.  Future plan for labs and cholesterol check. Keep on medicines, refilled to [facility] today.  Can meet new provider [facility]   DUE for FASTING BLOOD WORK (no food or drink after midnight before the lab appointment, only water or coffee without cream/sugar on the morning of)  SCHEDULE "Lab Only" visit in the morning at the clinic for lab draw in 3 MONTHS   - Make sure Lab Only appointment is at about 1 week before your next appointment, so that results will be available  For Lab Results, once available within 2-3 days of blood draw, you can can log in to MyChart online to view your results and a brief explanation. Also, we can discuss results at next follow-up visit.   Please schedule a Follow-up Appointment to: Return in about 3 months (around 07/23/2020) for 3 month Annual Physical w/ new provider, needs labs.  If you have any other questions or concerns, please feel free to call the office or send a message through MyChart. You may also schedule an earlier appointment if necessary.  Additionally, you may be receiving a survey about your experience at our office within a few days to 1 week by e-mail or mail. We value your feedback.  Saralyn Pilar, DO [facility], [place]
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
## 2020-06-20 ENCOUNTER — Ambulatory Visit: Payer: 59 | Admitting: Internal Medicine

## 2020-07-28 ENCOUNTER — Other Ambulatory Visit: Payer: Self-pay

## 2020-07-28 ENCOUNTER — Ambulatory Visit (INDEPENDENT_AMBULATORY_CARE_PROVIDER_SITE_OTHER): Payer: 59 | Admitting: Internal Medicine

## 2020-07-28 ENCOUNTER — Encounter: Payer: Self-pay | Admitting: Internal Medicine

## 2020-07-28 VITALS — BP 122/74 | HR 82 | Temp 97.3°F | Resp 17 | Ht 66.0 in | Wt 185.4 lb

## 2020-07-28 DIAGNOSIS — F320 Major depressive disorder, single episode, mild: Secondary | ICD-10-CM

## 2020-07-28 DIAGNOSIS — I10 Essential (primary) hypertension: Secondary | ICD-10-CM

## 2020-07-28 DIAGNOSIS — L237 Allergic contact dermatitis due to plants, except food: Secondary | ICD-10-CM | POA: Diagnosis not present

## 2020-07-28 DIAGNOSIS — F419 Anxiety disorder, unspecified: Secondary | ICD-10-CM

## 2020-07-28 DIAGNOSIS — K219 Gastro-esophageal reflux disease without esophagitis: Secondary | ICD-10-CM

## 2020-07-28 DIAGNOSIS — R053 Chronic cough: Secondary | ICD-10-CM | POA: Insufficient documentation

## 2020-07-28 DIAGNOSIS — L299 Pruritus, unspecified: Secondary | ICD-10-CM

## 2020-07-28 DIAGNOSIS — E782 Mixed hyperlipidemia: Secondary | ICD-10-CM

## 2020-07-28 MED ORDER — HYDROXYZINE HCL 25 MG PO TABS: 12.5000 mg | ORAL_TABLET | Freq: Two times a day (BID) | ORAL | 0 refills | Status: DC | PRN

## 2020-07-28 NOTE — Assessment & Plan Note (Signed)
Controlled off meds °Reinforced DASH diet and exercise for weight loss °Will monitor °

## 2020-07-28 NOTE — Progress Notes (Signed)
Subjective:    Patient ID: Christy Watson, female    DOB: [birth year], 60 y.o.   MRN: [id number]  HPI  Patient presents the clinic today for follow-up of chronic conditions.  She is establishing care with me today, transferring care from Malva Cogan, NP.  HTN: Her BP today is 122/74.  She is not taking any antihypertensive medication at this time.  ECG from 05/2016 reviewed.  GERD: Triggered by seltzer water.  She denies breakthrough on Omeprazole.  There is no upper GI on file.  HLD: Her last LDL was 154, triglycerides 200, 03/2019.  She denies myalgias on Rosuvastatin.  She tries to consume a low-fat diet.  Anxiety and Depression: Chronic, managed on Venlafaxine and BuSpar.  She is not currently seeing a therapist.  She denies SI/HI.  Pruritus: She takes Hydroxyzine as needed. She is out of this medication and would like a refill today. She does drink adequate amounts of water and does not take a daily antihistamine.  Chronic Cough: Managed on Flovent as needed. There are no PFT's on file. She does not follow with pulmonology.  Review of Systems     Past Medical History:  Diagnosis Date   Allergy    Depression    Hypercholesterolemia     Current Outpatient Medications  Medication Sig Dispense Refill   busPIRone (BUSPAR) 10 MG tablet Take 1 tablet (10 mg total) by mouth 2 (two) times daily. 180 tablet 1   cetirizine (ZYRTEC) 10 MG tablet TAKE 1 TABLET BY MOUTH EVERY DAY (Patient not taking: No sig reported) 90 tablet 2   Fluticasone Propionate, Inhal, (FLOVENT DISKUS) 100 MCG/BLIST AEPB Inhale 2 puffs into the lungs daily. 60 each 0   hydrOXYzine (ATARAX/VISTARIL) 25 MG tablet Take 0.5-1 tablets (12.5-25 mg total) by mouth 2 (two) times daily as needed for itching. (Patient not taking: No sig reported) 30 tablet 0   omeprazole (PRILOSEC) 20 MG capsule Take 1 capsule (20 mg total) by mouth daily before breakfast. 90 capsule 1   rosuvastatin (CRESTOR) 20 MG tablet Take 1 tablet  (20 mg total) by mouth daily. 90 tablet 1   venlafaxine XR (EFFEXOR-XR) 75 MG 24 hr capsule TAKE 1 CAPSULE BY MOUTH  DAILY WITH BREAKFAST 90 capsule 1   No current facility-administered medications for this visit.    Allergies  Allergen Reactions   Sulfa Antibiotics Hives    Family History  Problem Relation Age of Onset   Autoimmune disease Mother    Diabetes Father    CVA Father    Hyperlipidemia Father    Thyroid disease Sister    Autoimmune disease Sister    Hyperlipidemia Brother    Hypertension Brother    Graves' disease Brother    Thyroid disease Maternal Aunt    Bone cancer Maternal Grandfather    Breast cancer Paternal Grandmother 10   Brain cancer Paternal Grandfather 22    Social History   Socioeconomic History   Marital status: Married    Spouse name: Not on file   Number of children: Not on file   Years of education: Not on file   Highest education level: Not on file  Occupational History   Not on file  Tobacco Use   Smoking status: Never   Smokeless tobacco: Never  Vaping Use   Vaping Use: Never used  Substance and Sexual Activity   Alcohol use: No    Alcohol/week: 0.0 standard drinks   Drug use: No   Sexual activity: Not  Currently    Birth control/protection: None  Other Topics Concern   Not on file  Social History Narrative   Not on file   Social Determinants of Health   Financial Resource Strain: Not on file  Food Insecurity: Not on file  Transportation Needs: Not on file  Physical Activity: Not on file  Stress: Not on file  Social Connections: Not on file  Intimate Partner Violence: Not on file     Constitutional: Denies fever, malaise, fatigue, headache or abrupt weight changes.  HEENT: Denies eye pain, eye redness, ear pain, ringing in the ears, wax buildup, runny nose, nasal congestion, bloody nose, or sore throat. Respiratory: Pt reports intermittent cough. Denies difficulty breathing, shortness of breath, or sputum production.    Cardiovascular: Denies chest pain, chest tightness, palpitations or swelling in the hands or feet.  Gastrointestinal: Denies abdominal pain, bloating, constipation, diarrhea or blood in the stool.  GU: Denies urgency, frequency, pain with urination, burning sensation, blood in urine, odor or discharge. Musculoskeletal: Denies decrease in range of motion, difficulty with gait, muscle pain or joint pain and swelling.  Skin: Pt reports itching. Denies redness, rashes, lesions or ulcercations.  Neurological: Denies dizziness, difficulty with memory, difficulty with speech or problems with balance and coordination.  Psych: Pt has a history of anxiety and depression. Denies SI/HI.  No other specific complaints in a complete review of systems (except as listed in HPI above).  Objective:   Physical Exam  BP 122/74 (BP Location: Left Arm, Patient Position: Sitting, Cuff Size: Normal)   Pulse 82   Temp (!) 97.3 F (36.3 C) (Temporal)   Resp 17   Ht 5\' 6"  (1.676 m)   Wt 185 lb 6.4 oz (84.1 kg)   SpO2 99%   BMI 29.92 kg/m   Wt Readings from Last 3 Encounters:  04/22/20 192 lb 6.4 oz (87.3 kg)  04/17/20 188 lb (85.3 kg)  01/21/20 205 lb 3.2 oz (93.1 kg)    General: Appears her stated age, overweight in NAD. Skin: Warm, dry and intact.  HEENT: Head: normal shape and size; Eyes: sclera white and EOMs intact;  Cardiovascular: Normal rate and rhythm. S1,S2 noted.  No murmur, rubs or gallops noted. No JVD or BLE edema. No carotid bruits noted. Pulmonary/Chest: Normal effort and positive vesicular breath sounds. No respiratory distress. No wheezes, rales or ronchi noted.  Neurological: Alert and oriented.  Psychiatric: Mood and affect normal. Behavior is normal. Judgment and thought content normal.    BMET    Component Value Date/Time   NA 141 04/08/2019 0804   K 4.2 04/08/2019 0804   CL 103 04/08/2019 0804   CO2 31 04/08/2019 0804   GLUCOSE 90 04/08/2019 0804   BUN 12 04/08/2019 0804    CREATININE 0.70 04/08/2019 0804   CALCIUM 9.6 04/08/2019 0804   GFRNONAA 98 03/25/2018 0821   GFRAA 114 03/25/2018 0821    Lipid Panel     Component Value Date/Time   CHOL 249 (H) 04/08/2019 0804   TRIG 200 (H) 04/08/2019 0804   HDL 61 04/08/2019 0804   CHOLHDL 4.1 04/08/2019 0804   LDLCALC 154 (H) 04/08/2019 0804    CBC    Component Value Date/Time   WBC 5.3 04/08/2019 0804   RBC 4.81 04/08/2019 0804   HGB 14.6 04/08/2019 0804   HCT 42.9 04/08/2019 0804   PLT 226 04/08/2019 0804   MCV 89.2 04/08/2019 0804   MCH 30.4 04/08/2019 0804   MCHC 34.0 04/08/2019 0804  RDW 13.0 04/08/2019 0804   LYMPHSABS 1,574 04/08/2019 0804   EOSABS 159 04/08/2019 0804   BASOSABS 42 04/08/2019 0804    Hgb A1C Lab Results  Component Value Date   HGBA1C 5.4 04/08/2019           Assessment & Plan:    Nicki Reaper, NP This visit occurred during the SARS-CoV-2 public health emergency.  Safety protocols were in place, including screening questions prior to the visit, additional usage of staff PPE, and extensive cleaning of exam room while observing appropriate contact time as indicated for disinfecting solutions.

## 2020-07-28 NOTE — Assessment & Plan Note (Signed)
C-Met and lipid profile today Encouraged her to consume a low-fat diet Continue Rosuvastatin

## 2020-07-28 NOTE — Patient Instructions (Signed)
Heart-Healthy Eating Plan Heart-healthy meal planning includes: Eating less unhealthy fats. Eating more healthy fats. Making other changes in your diet. Talk with your doctor or a diet specialist (dietitian) to create an eating plan that is right for you. What is my plan? Your doctor may recommend an eating plan that includes: Total fat: ______% or less of total calories a day. Saturated fat: ______% or less of total calories a day. Cholesterol: less than _________mg a day. What are tips for following this plan? Cooking Avoid frying your food. Try to bake, boil, grill, or broil it instead. You can also reduce fat by: Removing the skin from poultry. Removing all visible fats from meats. Steaming vegetables in water or broth. Meal planning  At meals, divide your plate into four equal parts: Fill one-half of your plate with vegetables and green salads. Fill one-fourth of your plate with whole grains. Fill one-fourth of your plate with lean protein foods. Eat 4-5 servings of vegetables per day. A serving of vegetables is: 1 cup of raw or cooked vegetables. 2 cups of raw leafy greens. Eat 4-5 servings of fruit per day. A serving of fruit is: 1 medium whole fruit.  cup of dried fruit.  cup of fresh, frozen, or canned fruit.  cup of 100% fruit juice. Eat more foods that have soluble fiber. These are apples, broccoli, carrots, beans, peas, and barley. Try to get 20-30 g of fiber per day. Eat 4-5 servings of nuts, legumes, and seeds per week: 1 serving of dried beans or legumes equals  cup after being cooked. 1 serving of nuts is  cup. 1 serving of seeds equals 1 tablespoon.  General information Eat more home-cooked food. Eat less restaurant, buffet, and fast food. Limit or avoid alcohol. Limit foods that are high in starch and sugar. Avoid fried foods. Lose weight if you are overweight. Keep track of how much salt (sodium) you eat. This is important if you have high blood  pressure. Ask your doctor to tell you more about this. Try to add vegetarian meals each week. Fats Choose healthy fats. These include olive oil and canola oil, flaxseeds, walnuts, almonds, and seeds. Eat more omega-3 fats. These include salmon, mackerel, sardines, tuna, flaxseed oil, and ground flaxseeds. Try to eat fish at least 2 times each week. Check food labels. Avoid foods with trans fats or high amounts of saturated fat. Limit saturated fats. These are often found in animal products, such as meats, butter, and cream. These are also found in plant foods, such as palm oil, palm kernel oil, and coconut oil. Avoid foods with partially hydrogenated oils in them. These have trans fats. Examples are stick margarine, some tub margarines, cookies, crackers, and other baked goods. What foods can I eat? Fruits All fresh, canned (in natural juice), or frozen fruits. Vegetables Fresh or frozen vegetables (raw, steamed, roasted, or grilled). Green salads. Grains Most grains. Choose whole wheat and whole grains most of the time. Rice andpasta, including brown rice and pastas made with whole wheat. Meats and other proteins Lean, well-trimmed beef, veal, pork, and lamb. Chicken and turkey without skin. All fish and shellfish. Wild duck, rabbit, pheasant, and venison. Egg whites or low-cholesterol egg substitutes. Dried beans, peas, lentils, and tofu. Seedsand most nuts. Dairy Low-fat or nonfat cheeses, including ricotta and mozzarella. Skim or 1% milk that is liquid, powdered, or evaporated. Buttermilk that is made with low-fatmilk. Nonfat or low-fat yogurt. Fats and oils Non-hydrogenated (trans-free) margarines. Vegetable oils, including soybean, sesame,   sunflower, olive, peanut, safflower, corn, canola, and cottonseed. Salad dressings or mayonnaisemade with a vegetable oil. Beverages Mineral water. Coffee and tea. Diet carbonated beverages. Sweets and desserts Sherbet, gelatin, and fruit ice. Small  amounts of dark chocolate. Limit all sweets and desserts. Seasonings and condiments All seasonings and condiments. The items listed above may not be a complete list of foods and drinks you can eat. Contact a dietitian for more options. What foods should I avoid? Fruits Canned fruit in heavy syrup. Fruit in cream or butter sauce. Fried fruit. Limitcoconut. Vegetables Vegetables cooked in cheese, cream, or butter sauce. Fried vegetables. Grains Breads that are made with saturated or trans fats, oils, or whole milk. Croissants. Sweet rolls. Donuts. High-fat crackers,such as cheese crackers. Meats and other proteins Fatty meats, such as hot dogs, ribs, sausage, bacon, rib-eye roast or steak. High-fat deli meats, such as salami and bologna. Caviar. Domestic duck andgoose. Organ meats, such as liver. Dairy Cream, sour cream, cream cheese, and creamed cottage cheese. Whole-milk cheeses. Whole or 2% milk that is liquid, evaporated, or condensed. Whole buttermilk. Cream sauce or high-fat cheese sauce. Yogurt that is made fromwhole milk. Fats and oils Meat fat, or shortening. Cocoa butter, hydrogenated oils, palm oil, coconut oil, palm kernel oil. Solid fats and shortenings, including bacon fat, salt pork, lard, and butter. Nondairy cream substitutes. Salad dressings with cheeseor sour cream. Beverages Regular sodas and juice drinks with added sugar. Sweets and desserts Frosting. Pudding. Cookies. Cakes. Pies. Milk chocolate or white chocolate.Buttered syrups. Full-fat ice cream or ice cream drinks. The items listed above may not be a complete list of foods and drinks to avoid. Contact a dietitian for more information. Summary Heart-healthy meal planning includes eating less unhealthy fats, eating more healthy fats, and making other changes in your diet. Eat a balanced diet. This includes fruits and vegetables, low-fat or nonfat dairy, lean protein, nuts and legumes, whole grains, and heart-healthy  oils and fats. This information is not intended to replace advice given to you by your health care provider. Make sure you discuss any questions you have with your healthcare provider. Document Revised: 04/04/2017 Document Reviewed: 03/08/2017 Elsevier Patient Education  2022 Elsevier Inc.  

## 2020-07-28 NOTE — Assessment & Plan Note (Signed)
Hydroxyzine refilled today 

## 2020-07-28 NOTE — Assessment & Plan Note (Signed)
Continue Flovent as needed

## 2020-07-28 NOTE — Assessment & Plan Note (Signed)
Avoid foods that trigger reflux Encouraged weight loss as this can help reduce reflux symptoms Continue Omeprazole CBC and c-Met today 

## 2020-07-28 NOTE — Assessment & Plan Note (Signed)
Stable on Venlafaxine and BuSpar She is actively looking for a therapist but cannot find when she likes Support offered 

## 2020-07-28 NOTE — Assessment & Plan Note (Signed)
Stable on Venlafaxine and BuSpar She is actively looking for a therapist but cannot find when she likes Support offered

## 2020-07-29 LAB — HEMOGLOBIN A1C
Hgb A1c MFr Bld: 5.5 % of total Hgb (ref <5.7)
Mean Plasma Glucose: 111 mg/dL
eAG (mmol/L): 6.2 mmol/L

## 2020-07-29 LAB — COMPLETE METABOLIC PANEL WITH GFR
AG Ratio: 1.6 (calc) (ref 1.0–2.5)
ALT: 14 U/L (ref 6–29)
AST: 14 U/L (ref 10–35)
Albumin: 4.2 g/dL (ref 3.6–5.1)
Alkaline phosphatase (APISO): 83 U/L (ref 37–153)
BUN: 14 mg/dL (ref 7–25)
CO2: 28 mmol/L (ref 20–32)
Calcium: 9.2 mg/dL (ref 8.6–10.4)
Chloride: 104 mmol/L (ref 98–110)
Creat: 0.58 mg/dL (ref 0.50–1.05)
GFR, Est African American: 119 mL/min/{1.73_m2} (ref >=60)
GFR, Est Non African American: 102 mL/min/{1.73_m2} (ref >=60)
Globulin: 2.6 g/dL (calc) (ref 1.9–3.7)
Glucose, Bld: 81 mg/dL (ref 65–99)
Potassium: 4 mmol/L (ref 3.5–5.3)
Sodium: 139 mmol/L (ref 135–146)
Total Bilirubin: 0.5 mg/dL (ref 0.2–1.2)
Total Protein: 6.8 g/dL (ref 6.1–8.1)

## 2020-07-29 LAB — LIPID PANEL
Cholesterol: 217 mg/dL — ABNORMAL HIGH (ref <200)
HDL: 58 mg/dL (ref >=50)
LDL Cholesterol (Calc): 127 mg/dL (calc) — ABNORMAL HIGH
Non-HDL Cholesterol (Calc): 159 mg/dL (calc) — ABNORMAL HIGH (ref <130)
Total CHOL/HDL Ratio: 3.7 (calc) (ref <5.0)
Triglycerides: 208 mg/dL — ABNORMAL HIGH (ref <150)

## 2020-07-29 LAB — CBC
HCT: 42.5 % (ref 35.0–45.0)
Hemoglobin: 14 g/dL (ref 11.7–15.5)
MCH: 30.2 pg (ref 27.0–33.0)
MCHC: 32.9 g/dL (ref 32.0–36.0)
MCV: 91.6 fL (ref 80.0–100.0)
MPV: 9.3 fL (ref 7.5–12.5)
Platelets: 239 10*3/uL (ref 140–400)
RBC: 4.64 10*6/uL (ref 3.80–5.10)
RDW: 13.1 % (ref 11.0–15.0)
WBC: 5.3 10*3/uL (ref 3.8–10.8)

## 2020-09-20 ENCOUNTER — Other Ambulatory Visit: Payer: Self-pay | Admitting: Family Medicine

## 2020-09-20 DIAGNOSIS — F320 Major depressive disorder, single episode, mild: Secondary | ICD-10-CM

## 2020-09-20 NOTE — Telephone Encounter (Signed)
Requested Prescriptions  Pending Prescriptions Disp Refills  . venlafaxine XR (EFFEXOR-XR) 75 MG 24 hr capsule [Pharmacy Med Name: Venlafaxine HCl ER 75 MG Oral Capsule Extended Release 24 Hour] 90 capsule 1    Sig: TAKE 1 CAPSULE BY MOUTH  DAILY WITH BREAKFAST     Psychiatry: Antidepressants - SNRI - desvenlafaxine & venlafaxine Failed - 09/20/2020 10:53 PM      Failed - LDL in normal range and within 360 days    LDL Cholesterol (Calc)  Date Value Ref Range Status  07/28/2020 127 (H) mg/dL (calc) Final    Comment:    Reference range: <100 . Desirable range <100 mg/dL for primary prevention;   <70 mg/dL for patients with CHD or diabetic patients  with > or = 2 CHD risk factors. Marland Kitchen LDL-C is now calculated using the Martin-Hopkins  calculation, which is a validated novel method providing  better accuracy than the Friedewald equation in the  estimation of LDL-C.  Horald Pollen et al. Lenox Ahr. 1950;932(67): 2061-2068  (http://education.[facility].com/faq/FAQ164)          Failed - Total Cholesterol in normal range and within 360 days    Cholesterol  Date Value Ref Range Status  07/28/2020 217 (H) <200 mg/dL Final         Failed - Triglycerides in normal range and within 360 days    Triglycerides  Date Value Ref Range Status  07/28/2020 208 (H) <150 mg/dL Final    Comment:    . If a non-fasting specimen was collected, consider repeat triglyceride testing on a fasting specimen if clinically indicated.  Perry Mount et al. J. of Clin. Lipidol. 2015;9:129-169. Marland Kitchen          Passed - Completed PHQ-2 or PHQ-9 in the last 360 days      Passed - Last BP in normal range    BP Readings from Last 1 Encounters:  07/28/20 122/74         Passed - Valid encounter within last 6 months    Recent Outpatient Visits          1 month ago Anxiety   [facility], Salvadore Oxford, NP   5 months ago Scalp pain   [facility] Smitty Cords, DO   8 months ago  Anxiety   [facility], Jodelle Gross, FNP   1 year ago Poison ivy dermatitis   [facility], Netta Neat, DO   1 year ago Mild major depression [facility]   [facility], Jodelle Gross, FNP      Future Appointments            In 10 months Baity, Salvadore Oxford, NP [facility], [facility]

## 2021-03-03 DIAGNOSIS — M7541 Impingement syndrome of right shoulder: Secondary | ICD-10-CM | POA: Insufficient documentation

## 2021-03-22 DIAGNOSIS — M25511 Pain in right shoulder: Secondary | ICD-10-CM | POA: Diagnosis not present

## 2021-03-22 DIAGNOSIS — M25611 Stiffness of right shoulder, not elsewhere classified: Secondary | ICD-10-CM | POA: Diagnosis not present

## 2021-03-30 DIAGNOSIS — M25611 Stiffness of right shoulder, not elsewhere classified: Secondary | ICD-10-CM | POA: Diagnosis not present

## 2021-03-30 DIAGNOSIS — M25511 Pain in right shoulder: Secondary | ICD-10-CM | POA: Diagnosis not present

## 2021-04-04 DIAGNOSIS — M25611 Stiffness of right shoulder, not elsewhere classified: Secondary | ICD-10-CM | POA: Diagnosis not present

## 2021-04-04 DIAGNOSIS — M25511 Pain in right shoulder: Secondary | ICD-10-CM | POA: Diagnosis not present

## 2021-04-11 DIAGNOSIS — M25511 Pain in right shoulder: Secondary | ICD-10-CM | POA: Diagnosis not present

## 2021-04-11 DIAGNOSIS — M25611 Stiffness of right shoulder, not elsewhere classified: Secondary | ICD-10-CM | POA: Diagnosis not present

## 2021-04-22 DIAGNOSIS — M25511 Pain in right shoulder: Secondary | ICD-10-CM | POA: Diagnosis not present

## 2021-04-22 DIAGNOSIS — M25611 Stiffness of right shoulder, not elsewhere classified: Secondary | ICD-10-CM | POA: Diagnosis not present

## 2021-06-29 ENCOUNTER — Other Ambulatory Visit: Payer: Self-pay | Admitting: Student

## 2021-06-29 DIAGNOSIS — Z0000 Encounter for general adult medical examination without abnormal findings: Secondary | ICD-10-CM | POA: Diagnosis not present

## 2021-06-29 DIAGNOSIS — F419 Anxiety disorder, unspecified: Secondary | ICD-10-CM | POA: Diagnosis not present

## 2021-06-29 DIAGNOSIS — K219 Gastro-esophageal reflux disease without esophagitis: Secondary | ICD-10-CM | POA: Diagnosis not present

## 2021-06-29 DIAGNOSIS — G8929 Other chronic pain: Secondary | ICD-10-CM | POA: Insufficient documentation

## 2021-06-29 DIAGNOSIS — Z131 Encounter for screening for diabetes mellitus: Secondary | ICD-10-CM | POA: Diagnosis not present

## 2021-06-29 DIAGNOSIS — F32A Depression, unspecified: Secondary | ICD-10-CM | POA: Insufficient documentation

## 2021-06-29 DIAGNOSIS — Z1231 Encounter for screening mammogram for malignant neoplasm of breast: Secondary | ICD-10-CM

## 2021-06-29 DIAGNOSIS — Z1331 Encounter for screening for depression: Secondary | ICD-10-CM | POA: Diagnosis not present

## 2021-06-29 DIAGNOSIS — E782 Mixed hyperlipidemia: Secondary | ICD-10-CM | POA: Diagnosis not present

## 2021-07-06 DIAGNOSIS — F4320 Adjustment disorder, unspecified: Secondary | ICD-10-CM | POA: Diagnosis not present

## 2021-07-12 ENCOUNTER — Ambulatory Visit (INDEPENDENT_AMBULATORY_CARE_PROVIDER_SITE_OTHER): Payer: BC Managed Care – PPO

## 2021-07-12 ENCOUNTER — Encounter: Payer: Self-pay | Admitting: Podiatry

## 2021-07-12 ENCOUNTER — Ambulatory Visit (INDEPENDENT_AMBULATORY_CARE_PROVIDER_SITE_OTHER): Payer: BC Managed Care – PPO | Admitting: Podiatry

## 2021-07-12 DIAGNOSIS — M24171 Other articular cartilage disorders, right ankle: Secondary | ICD-10-CM

## 2021-07-12 DIAGNOSIS — M25371 Other instability, right ankle: Secondary | ICD-10-CM

## 2021-07-12 DIAGNOSIS — M898X7 Other specified disorders of bone, ankle and foot: Secondary | ICD-10-CM

## 2021-07-12 DIAGNOSIS — M7751 Other enthesopathy of right foot: Secondary | ICD-10-CM

## 2021-07-12 DIAGNOSIS — Z87828 Personal history of other (healed) physical injury and trauma: Secondary | ICD-10-CM

## 2021-07-12 NOTE — Progress Notes (Signed)
Subjective:  Patient ID: Christy Watson, female    DOB: [birth year],  MRN: [id number] HPI Chief Complaint  Patient presents with   Ankle Pain    Ankle (whole) right - double jointed, rolled ankle multiple times, swelling laterally, worn orthotics since she was 15, over the past year having sharp, shooting pains intermittent, worsened over the past 2 weeks, wears compression sleeve for the swelling, feels like when she is walking the ankle is sliding out of place   New Patient (Initial Visit)    Est pt 2016    61 y.o. female presents with the above complaint.   ROS: Denies fever chills nausea vomiting muscle aches pains calf pain back pain chest pain shortness of breath.  She states that this ankle is starting to affect her ability to perform her daily activities and continue to allow her to get exercise to maintain her good health.  She states that with every step she takes it feels is going to fall out from under her and caused her to injure something else.  Past Medical History:  Diagnosis Date   Allergy    Depression    Hypercholesterolemia    Past Surgical History:  Procedure Laterality Date   CHOLECYSTECTOMY  2010    Current Outpatient Medications:    Ascorbic Acid (VITAMIN C PO), Take by mouth., Disp: , Rfl:    busPIRone (BUSPAR) 10 MG tablet, Take 1 tablet (10 mg total) by mouth 2 (two) times daily. (Patient taking differently: Take 10 mg by mouth daily as needed.), Disp: 180 tablet, Rfl: 1   omeprazole (PRILOSEC) 20 MG capsule, Take 1 capsule (20 mg total) by mouth daily before breakfast., Disp: 90 capsule, Rfl: 1   rosuvastatin (CRESTOR) 20 MG tablet, Take 1 tablet (20 mg total) by mouth daily., Disp: 90 tablet, Rfl: 1   venlafaxine XR (EFFEXOR-XR) 75 MG 24 hr capsule, TAKE 1 CAPSULE BY MOUTH  DAILY WITH BREAKFAST, Disp: 90 capsule, Rfl: 1  Allergies  Allergen Reactions   Sulfa Antibiotics Hives   Review of Systems Objective:  There were no vitals filed for this  visit.  General: Well developed, nourished, in no acute distress, alert and oriented x3   Dermatological: Skin is warm, dry and supple bilateral. Nails x 10 are well maintained; remaining integument appears unremarkable at this time. There are no open sores, no preulcerative lesions, no rash or signs of infection present.  Vascular: Dorsalis Pedis artery and Posterior Tibial artery pedal pulses are 2/4 bilateral with immedate capillary fill time. Pedal hair growth present. No varicosities and no lower extremity edema present bilateral.   Neruologic: Grossly intact via light touch bilateral. Vibratory intact via tuning fork bilateral. Protective threshold with Semmes Wienstein monofilament intact to all pedal sites bilateral. Patellar and Achilles deep tendon reflexes 2+ bilateral. No Babinski or clonus noted bilateral.   Musculoskeletal: No gross boney pedal deformities bilateral. No pain, crepitus, or limitation noted with foot and ankle range of motion bilateral. Muscular strength 5/5 in all groups tested bilateral.  Ankle demonstrates instability on physical examination I was unable to reproduce the exact pain that she was having on physical exam due to her guarding against certain motions.  There is some crepitation anteriorly in the ankle joint does have some medial lateral instability.  There is no anterior drawer sign.  No posterior drawer sign.  Gait: Unassisted, Nonantalgic.    Radiographs:  Radiographs taken today demonstrate an osseously mature individual with what appears to be an old  fracture to the posterior talar dome and a small os trigonum that is present now.  She also demonstrates some joint space narrowing and some joint irregularity particular the lateral view looking anteriorly there appears to be some anterior spurring from the anterior distal tibia and from the talar neck.  I see no fractures.  Assessment & Plan:   Assessment: Chronic ankle pain limiting lifestyle.   Unstable resulting in a fall risk.  Plan: Discussed etiology pathology conservative versus surgical therapies.  Patient has tried and failed conservative therapy such as simple bracing shoe gear changes orthotics and anti-inflammatories.  We are requesting MRI of the ankle for surgical intervention.     Wasim Hurlbut T. [place], [place]

## 2021-07-19 ENCOUNTER — Ambulatory Visit
Admission: RE | Admit: 2021-07-19 | Discharge: 2021-07-19 | Disposition: A | Discharge status: Home / Self Care | Payer: BC Managed Care – PPO | Source: Ambulatory Visit | Attending: Podiatry | Admitting: Podiatry

## 2021-07-19 DIAGNOSIS — M24171 Other articular cartilage disorders, right ankle: Secondary | ICD-10-CM

## 2021-07-19 DIAGNOSIS — M67471 Ganglion, right ankle and foot: Secondary | ICD-10-CM | POA: Diagnosis not present

## 2021-07-19 DIAGNOSIS — R600 Localized edema: Secondary | ICD-10-CM | POA: Diagnosis not present

## 2021-07-19 DIAGNOSIS — M25371 Other instability, right ankle: Secondary | ICD-10-CM

## 2021-07-19 DIAGNOSIS — M898X7 Other specified disorders of bone, ankle and foot: Secondary | ICD-10-CM

## 2021-07-19 DIAGNOSIS — Z87828 Personal history of other (healed) physical injury and trauma: Secondary | ICD-10-CM

## 2021-07-20 DIAGNOSIS — F4320 Adjustment disorder, unspecified: Secondary | ICD-10-CM | POA: Diagnosis not present

## 2021-07-25 ENCOUNTER — Ambulatory Visit: Payer: 59 | Admitting: Family Medicine

## 2021-07-31 ENCOUNTER — Ambulatory Visit: Payer: BC Managed Care – PPO | Admitting: Podiatry

## 2021-07-31 ENCOUNTER — Ambulatory Visit
Admission: RE | Admit: 2021-07-31 | Discharge: 2021-07-31 | Disposition: A | Discharge status: Home / Self Care | Payer: BC Managed Care – PPO | Source: Ambulatory Visit | Attending: Student | Admitting: Student

## 2021-07-31 DIAGNOSIS — Z1231 Encounter for screening mammogram for malignant neoplasm of breast: Secondary | ICD-10-CM | POA: Diagnosis not present

## 2021-08-02 ENCOUNTER — Encounter: Payer: 59 | Admitting: Internal Medicine

## 2021-08-21 ENCOUNTER — Encounter: Payer: Self-pay | Admitting: Podiatry

## 2021-08-21 ENCOUNTER — Ambulatory Visit (INDEPENDENT_AMBULATORY_CARE_PROVIDER_SITE_OTHER): Payer: BC Managed Care – PPO | Admitting: Podiatry

## 2021-08-21 DIAGNOSIS — M25371 Other instability, right ankle: Secondary | ICD-10-CM | POA: Diagnosis not present

## 2021-08-21 DIAGNOSIS — M7751 Other enthesopathy of right foot: Secondary | ICD-10-CM | POA: Diagnosis not present

## 2021-08-21 NOTE — Progress Notes (Signed)
Subjective:  Patient ID: Christy Watson, female    DOB: Feb 22, 1960,  MRN: [id number]  Chief Complaint  Patient presents with   consult   She was referred to me by Dr. Al Corpus for right ankle instability, she completed the MRI.  This has been an ongoing issue that has been problematic for her since she was younger.  She has rolled and sprained severely this ankle many times.  She primarily complains of pain in the lateral and anterior ankle joint  61 y.o. female presents with the above complaint. History confirmed with patient.   Objective:  Physical Exam: warm, good capillary refill, no trophic changes or ulcerative lesions, normal DP and PT pulses, normal sensory exam, and pain to palpation over ATFL, positive anterior drawer, pain over anterior ankle joint line and worse with dorsiflexion and palpation.  Study Result  Narrative & Impression  CLINICAL DATA:  Ankle pain, instability.  Negative x-ray.   EXAM: MRI OF THE RIGHT ANKLE WITHOUT CONTRAST   TECHNIQUE: Multiplanar, multisequence MR imaging of the ankle was performed. No intravenous contrast was administered.   COMPARISON:  Radiographs dated Jul 12, 2021   FINDINGS: TENDONS   Peroneal: Peroneal longus tendon intact. Flattening and possible split tear of the peroneus brevis. Trace amount of fluid along the peroneal tendon concerning for tenosynovitis.   Posteromedial: Posterior tibial tendon intact. Flexor hallucis longus tendon intact. Flexor digitorum longus tendon intact.   Anterior: Tibialis anterior tendon intact. Extensor hallucis longus tendon intact Extensor digitorum longus tendon intact.   Achilles:  Intact.   Plantar Fascia: Intact.   LIGAMENTS   Lateral: Nonvisualization of the anterior talofibular ligament with edema about the expected location suggesting subacute full-thickness tear. Calcaneofibular ligament intact. Posterior talofibular ligament intact. Anterior and posterior tibiofibular  ligaments intact.   Medial: Deltoid ligament intact. Spring ligament intact.   CARTILAGE   Ankle Joint: No joint effusion. Normal ankle mortise. No chondral defect.   Subtalar Joints/Sinus Tarsi: Normal subtalar joints. No subtalar joint effusion. Normal sinus tarsi.   Bones: No marrow signal abnormality.  No fracture or dislocation.   Soft Tissue: Ganglion cyst between the flexor digitorum and flexor hallucis longus above the ankle joint measuring approximately 0.7 x 1.0 x 1.7 cm. Muscles are normal without edema or atrophy. Tarsal tunnel is normal.   IMPRESSION: 1.  No evidence of fracture or dislocation.   2. No significant joint effusion or evidence of osteochondral injury.   3. Nonvisualization of the anterior talofibular ligament with edema about the expected location suggesting subacute full-thickness tear.   4. Split tear of the peroneus brevis and tenosynovitis of the peroneus longus/brevis.   5. Ganglion cyst between the flexor digitorum and flexor hallucis longus at above the level of the ankle joint measuring approximately 0.7 x 1.0 x 1.7 cm.   6. Mild generalized subcutaneous soft tissue edema of the ankle and distal leg.     Electronically Signed   By: Larose Hires D.O.   On: 07/20/2021 13:34   Assessment:   1. Capsulitis of right ankle   2. Unstable ankle, right      Plan:  Patient was evaluated and treated and all questions answered.  I reviewed the results and images of the MRI with the patient in detail.  We discussed the tear of the ATFL and how this leads to ankle instability.  This has been a chronic long-term issue.  She has not done any physical therapy for this.  I recommended physical therapy  for stabilization of the ankle prior to proceeding with surgical intervention.  Referral was sent to [facility] physical therapy.  We also discussed the anterior ankle impingement and capsulitis resulting to and from this.  We discussed that the  impingement causes inflammation and I recommended corticosteroid injection to see if this reduces inflammation.  Following this and sterile prep with Betadine 20 mg of Kenalog and 1 cc of lidocaine was injected into the right ankle from an anterior medial approach.  She tolerated this well and was dressed with a Band-Aid.  We also discussed if this does not improve that ankle arthroscopy with debridement as well as surgical stabilization of the ankle may be indicated.  I will follow-up with her in 6 weeks to see how she is doing following physical therapy.  Return in about 6 weeks (around 10/02/2021) for recheck ankle instability, injection.

## 2021-09-07 DIAGNOSIS — M25571 Pain in right ankle and joints of right foot: Secondary | ICD-10-CM | POA: Diagnosis not present

## 2021-09-13 DIAGNOSIS — M25571 Pain in right ankle and joints of right foot: Secondary | ICD-10-CM | POA: Diagnosis not present

## 2021-09-14 ENCOUNTER — Other Ambulatory Visit (HOSPITAL_COMMUNITY)
Admission: RE | Admit: 2021-09-14 | Discharge: 2021-09-14 | Disposition: A | Discharge status: Home / Self Care | Payer: BC Managed Care – PPO | Source: Ambulatory Visit | Attending: Obstetrics | Admitting: Obstetrics

## 2021-09-14 ENCOUNTER — Ambulatory Visit (INDEPENDENT_AMBULATORY_CARE_PROVIDER_SITE_OTHER): Payer: BC Managed Care – PPO | Admitting: Obstetrics

## 2021-09-14 ENCOUNTER — Encounter: Payer: Self-pay | Admitting: Obstetrics

## 2021-09-14 VITALS — BP 126/80 | Ht 66.0 in | Wt 200.0 lb

## 2021-09-14 DIAGNOSIS — Z124 Encounter for screening for malignant neoplasm of cervix: Secondary | ICD-10-CM | POA: Insufficient documentation

## 2021-09-14 NOTE — Progress Notes (Signed)
Gynecology Annual Exam  PCP: Carren Rang, PA-C  Chief Complaint:  Chief Complaint  Patient presents with   Annual Exam    History of Present Illness:Patient is a 61 y.o. G2P0011 presents for annual exam. The patient has no complaints today. She does continue to have rare hot flashes. Anhthu works in Landscape [employer], she is married, and reports that she ans her husband are not sexually intimate.  LMP: No LMP recorded. Patient is postmenopausal.  The patient is not currently sexually active. She denies dyspareunia.  The patient does perform self breast exams.  There is no notable family history of breast or ovarian cancer in her family.  The patient wears seatbelts: yes.   The patient has regular exercise: no.    The patient denies current symptoms of depression.     Review of Systems: Review of Systems  Constitutional: Negative.   HENT: Negative.    Eyes: Negative.   Respiratory: Negative.    Cardiovascular: Negative.   Gastrointestinal: Negative.   Genitourinary: Negative.   Musculoskeletal: Negative.   Skin: Negative.   Neurological: Negative.   Endo/Heme/Allergies: Negative.   Psychiatric/Behavioral: Negative.      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 Patient is postmenopausal. Last Pap: Results were:  2019 no abnormalities  Last mammogram: 2023 Results were: BI-RAD I  Obstetric History: G2P0011  Family History:  Family History  Problem Relation Age of Onset   Autoimmune disease Mother    Diabetes Father    CVA Father    Hyperlipidemia Father    Thyroid disease Sister    Autoimmune disease Sister    Hyperlipidemia Brother    Hypertension Brother    Graves' disease Brother    Thyroid disease Maternal Aunt    Bone cancer Maternal Grandfather    Breast cancer Paternal Grandmother 12   Brain cancer Paternal Grandfather 21    Social History:  Social History   Socioeconomic History   Marital status: Married    Spouse name: Not on file   Number of children: Not on file   Years of education: Not on file   Highest education level: Not on file  Occupational History   Not on file  Tobacco Use   Smoking status: Never   Smokeless tobacco: Never  Vaping Use   Vaping Use: Never used  Substance and Sexual Activity   Alcohol use: Yes    Comment: occasional   Drug use: No   Sexual activity: Not Currently    Birth control/protection: Post-menopausal  Other Topics Concern   Not on file  Social History Narrative   Not on file   Social Determinants of Health   Financial Resource Strain: Not on file  Food Insecurity: Not on file  Transportation Needs: Not on file  Physical Activity: Unknown (07/15/2017)   Exercise Vital Sign    Days of Exercise per Week: 0 days    Minutes of Exercise per Session: Not on file  Stress: Not on file  Social Connections: Not on file  Intimate Partner Violence: Not on file    Allergies:  Allergies  Allergen Reactions   Sulfa Antibiotics Hives    Medications: Prior to Admission medications   Medication Sig Start Date End Date Taking? Authorizing Provider  Ascorbic Acid (VITAMIN C PO) Take by mouth.   Yes [provider]  busPIRone (BUSPAR) 10 MG tablet Take 1 tablet (10 mg total) by mouth 2 (two) times daily. Patient taking  differently: Take 10 mg by mouth daily as needed. 04/22/20  Yes Karamalegos, Netta Neat, DO  omeprazole (PRILOSEC) 20 MG capsule Take 1 capsule (20 mg total) by mouth daily before breakfast. 04/22/20  Yes Karamalegos, Netta Neat, DO  rosuvastatin (CRESTOR) 20 MG tablet Take 1 tablet (20 mg total) by mouth daily. 03/21/20  Yes Karamalegos, Netta Neat, DO  venlafaxine XR (EFFEXOR-XR) 75 MG 24 hr capsule TAKE 1 CAPSULE BY MOUTH  DAILY WITH BREAKFAST 09/20/20  Yes Lorre Munroe, NP    Physical Exam Vitals: Blood pressure 126/80, height 5\' 6"  (1.676 m), weight 200 lb (90.7 kg).  General: NAD HEENT: normocephalic, anicteric Thyroid: no enlargement, no palpable nodules Pulmonary: No increased work of breathing, CTAB Cardiovascular: RRR, distal pulses 2+ Breast: Breast symmetrical, no tenderness, no palpable nodules or masses, no skin or nipple retraction present, no nipple discharge.  No axillary or supraclavicular lymphadenopathy. Abdomen: NABS, soft, non-tender, non-distended.  Umbilicus without lesions.  No hepatomegaly, splenomegaly or masses palpable. No evidence of hernia  Genitourinary:  External: Normal external female genitalia.  Normal urethral meatus, normal Bartholin's and Skene's glands.    Vagina: Normal vaginal mucosa, no evidence of prolapse.    Cervix: Grossly normal in appearance, no bleeding  Uterus: Non-enlarged, mobile, normal contour.  No CMT  Adnexa: ovaries non-enlarged, no adnexal masses  Rectal: deferred  Lymphatic: no evidence of inguinal lymphadenopathy Extremities: no edema, erythema, or tenderness Neurologic: Grossly intact Psychiatric: mood appropriate, affect full  Female chaperone present for pelvic and breast  portions of the physical exam     Assessment: 61 y.o. G2P0011 routine annual exam  Plan: Problem List Items Addressed This Visit   None Visit Diagnoses     Cervical cancer screening    -  Primary   Relevant Orders   Cytology - PAP       1)  Mammogram - recommend yearly screening mammogram.  Mammogram Is up to date  2) STI screening  was notoffered and therefore not obtained  3) ASCCP guidelines and rational discussed.  Patient opts for  today having  screening and then a 5 year interval. Her sister has just been diagnosed with uterine cancer - Stage 4.  4) Osteoporosis  - per USPTF routine screening DEXA at age 70 She will discuss this with her PCP  Consider FDA-approved medical therapies in postmenopausal women and men aged 75 years and older, based on the following: a) A hip or vertebral (clinical or morphometric) fracture b) T-score ? -2.5 at the femoral neck or spine after appropriate evaluation to exclude secondary causes C) Low bone mass (T-score between -1.0 and -2.5 at the femoral neck or spine) and a 10-year probability of a hip fracture ? 3% or a 10-year probability of a major osteoporosis-related fracture ? 20% based on the [place]-adapted  WHO algorithm   5) Routine healthcare maintenance including cholesterol, diabetes screening discussed managed by PCP  6) Colonoscopy she is setting this up with her PCP.  Screening recommended starting at age 54 for average risk individuals, age 6 for individuals deemed at increased risk (including African Americans) and recommended to continue until age 66.  For patient age 21-85 individualized approach is recommended.  Gold standard screening is via colonoscopy, Cologuard screening is an acceptable alternative for patient unwilling or unable to undergo colonoscopy.  "Colorectal cancer screening for average?risk adults: 2018 guideline update from the American Cancer Society"[place]: A Cancer Journal for Clinicians: Jul 11, 2016   7) Return in about 1 year (around 09/15/2022) for annual. Pap smear is done today for her reassurance.    Mirna Mires, CNM  09/14/2021 9:25 AM   Lauralee Evener [facility] 09/14/2021, 9:25 AM

## 2021-09-18 DIAGNOSIS — M25571 Pain in right ankle and joints of right foot: Secondary | ICD-10-CM | POA: Diagnosis not present

## 2021-09-18 LAB — CYTOLOGY - PAP
Comment: NEGATIVE
Diagnosis: NEGATIVE
High risk HPV: NEGATIVE

## 2021-09-20 ENCOUNTER — Encounter: Payer: Self-pay | Admitting: Obstetrics

## 2021-09-21 DIAGNOSIS — M25571 Pain in right ankle and joints of right foot: Secondary | ICD-10-CM | POA: Diagnosis not present

## 2021-09-25 DIAGNOSIS — M25571 Pain in right ankle and joints of right foot: Secondary | ICD-10-CM | POA: Diagnosis not present

## 2021-10-02 ENCOUNTER — Ambulatory Visit: Payer: BC Managed Care – PPO | Admitting: Podiatry

## 2021-10-02 DIAGNOSIS — M25879 Other specified joint disorders, unspecified ankle and foot: Secondary | ICD-10-CM | POA: Diagnosis not present

## 2021-10-02 DIAGNOSIS — M19071 Primary osteoarthritis, right ankle and foot: Secondary | ICD-10-CM | POA: Diagnosis not present

## 2021-10-02 DIAGNOSIS — M898X7 Other specified disorders of bone, ankle and foot: Secondary | ICD-10-CM | POA: Diagnosis not present

## 2021-10-02 DIAGNOSIS — S93401A Sprain of unspecified ligament of right ankle, initial encounter: Secondary | ICD-10-CM | POA: Diagnosis not present

## 2021-10-04 ENCOUNTER — Ambulatory Visit: Payer: BC Managed Care – PPO | Admitting: Podiatry

## 2021-10-09 NOTE — Anesthesia Preprocedure Evaluation (Addendum)
Anesthesia Evaluation  Patient identified by MRN, date of birth, ID band Patient awake    Reviewed: Allergy & Precautions, NPO status , Patient's Chart, lab work & pertinent test results  Airway Mallampati: III  TM Distance: >3 FB Neck ROM: full    Dental  (+) Chipped   Pulmonary neg pulmonary ROS,    Pulmonary exam normal        Cardiovascular negative cardio ROS Normal cardiovascular exam     Neuro/Psych PSYCHIATRIC DISORDERS Anxiety Depression negative neurological ROS     GI/Hepatic Neg liver ROS, GERD  ,  Endo/Other  negative endocrine ROS  Renal/GU negative Renal ROS  negative genitourinary   Musculoskeletal   Abdominal   Peds  Hematology negative hematology ROS (+)   Anesthesia Other Findings Past Medical History: No date: Allergy No date: Depression No date: Hypercholesterolemia  Past Surgical History: 2010: CHOLECYSTECTOMY     Reproductive/Obstetrics negative OB ROS                             Anesthesia Physical Anesthesia Plan  ASA: 2  Anesthesia Plan: General   Post-op Pain Management: Regional block   Induction: Intravenous  PONV Risk Score and Plan: 3 and Ondansetron and Dexamethasone  Airway Management Planned: LMA  Additional Equipment:   Intra-op Plan:   Post-operative Plan:   Informed Consent:   Plan Discussed with: Anesthesiologist, CRNA and Surgeon  Anesthesia Plan Comments:        Anesthesia Quick Evaluation

## 2021-10-12 DIAGNOSIS — S86311D Strain of muscle(s) and tendon(s) of peroneal muscle group at lower leg level, right leg, subsequent encounter: Secondary | ICD-10-CM | POA: Diagnosis not present

## 2021-10-12 DIAGNOSIS — M25879 Other specified joint disorders, unspecified ankle and foot: Secondary | ICD-10-CM | POA: Diagnosis not present

## 2021-10-12 DIAGNOSIS — M25571 Pain in right ankle and joints of right foot: Secondary | ICD-10-CM | POA: Diagnosis not present

## 2021-10-12 DIAGNOSIS — E782 Mixed hyperlipidemia: Secondary | ICD-10-CM | POA: Diagnosis not present

## 2021-10-30 ENCOUNTER — Encounter: Payer: Self-pay | Admitting: Podiatry

## 2021-10-30 DIAGNOSIS — M898X7 Other specified disorders of bone, ankle and foot: Secondary | ICD-10-CM | POA: Diagnosis not present

## 2021-10-30 DIAGNOSIS — S93401A Sprain of unspecified ligament of right ankle, initial encounter: Secondary | ICD-10-CM | POA: Diagnosis not present

## 2021-10-30 DIAGNOSIS — M25879 Other specified joint disorders, unspecified ankle and foot: Secondary | ICD-10-CM | POA: Diagnosis not present

## 2021-10-30 DIAGNOSIS — M19071 Primary osteoarthritis, right ankle and foot: Secondary | ICD-10-CM | POA: Diagnosis not present

## 2021-10-31 ENCOUNTER — Other Ambulatory Visit: Payer: Self-pay | Admitting: Podiatry

## 2021-11-01 NOTE — Discharge Instructions (Signed)
[facility]  POST OPERATIVE INSTRUCTIONS FOR DR. FOWLER AND [facility] P[facility]PART[facility]   Take your medication as prescribed.  Pain medication should be taken only as needed.  Keep the dressing clean, dry and intact.  Keep your foot elevated above the heart level for the first 48 hours.  Walking to the bathroom and brief periods of walking are acceptable, unless we have instructed you to be non-weight bearing.  Always wear your post-op shoe when walking.  Always use your crutches if you are to be non-weight bearing.  Do not take a shower. Baths are permissible as long as the foot is kept out of the water.   Every hour you are awake:  Bend your knee 15 times. Flex foot 15 times Massage calf 15 times  Call [facility] ([phone]) if any of the following problems occur: You develop a temperature or fever. The bandage becomes saturated with blood. Medication does not stop your pain. Injury of the foot occurs. Any symptoms of infection including redness, odor, or red streaks running from wound. 

## 2021-11-02 ENCOUNTER — Ambulatory Visit: Payer: BC Managed Care – PPO | Admitting: Anesthesiology

## 2021-11-02 ENCOUNTER — Other Ambulatory Visit: Payer: Self-pay

## 2021-11-02 ENCOUNTER — Encounter: Payer: Self-pay | Admitting: Podiatry

## 2021-11-02 ENCOUNTER — Encounter
Admission: RE | Disposition: A | Discharge status: Home / Self Care | Payer: Self-pay | Source: Home / Self Care | Attending: Podiatry

## 2021-11-02 ENCOUNTER — Ambulatory Visit
Admission: RE | Admit: 2021-11-02 | Discharge: 2021-11-02 | Disposition: A | Discharge status: Home / Self Care | Payer: BC Managed Care – PPO | Attending: Podiatry | Admitting: Podiatry

## 2021-11-02 DIAGNOSIS — G8929 Other chronic pain: Secondary | ICD-10-CM | POA: Insufficient documentation

## 2021-11-02 DIAGNOSIS — X58XXXA Exposure to other specified factors, initial encounter: Secondary | ICD-10-CM | POA: Diagnosis not present

## 2021-11-02 DIAGNOSIS — M25871 Other specified joint disorders, right ankle and foot: Secondary | ICD-10-CM | POA: Insufficient documentation

## 2021-11-02 DIAGNOSIS — M7671 Peroneal tendinitis, right leg: Secondary | ICD-10-CM | POA: Diagnosis not present

## 2021-11-02 DIAGNOSIS — M25371 Other instability, right ankle: Secondary | ICD-10-CM | POA: Diagnosis not present

## 2021-11-02 DIAGNOSIS — M65871 Other synovitis and tenosynovitis, right ankle and foot: Secondary | ICD-10-CM | POA: Diagnosis not present

## 2021-11-02 DIAGNOSIS — E7800 Pure hypercholesterolemia, unspecified: Secondary | ICD-10-CM | POA: Diagnosis not present

## 2021-11-02 DIAGNOSIS — M93971 Osteochondropathy, unspecified, right ankle and foot: Secondary | ICD-10-CM | POA: Diagnosis not present

## 2021-11-02 DIAGNOSIS — F32A Depression, unspecified: Secondary | ICD-10-CM | POA: Diagnosis not present

## 2021-11-02 DIAGNOSIS — F419 Anxiety disorder, unspecified: Secondary | ICD-10-CM | POA: Diagnosis not present

## 2021-11-02 DIAGNOSIS — K219 Gastro-esophageal reflux disease without esophagitis: Secondary | ICD-10-CM | POA: Insufficient documentation

## 2021-11-02 DIAGNOSIS — M19071 Primary osteoarthritis, right ankle and foot: Secondary | ICD-10-CM | POA: Diagnosis not present

## 2021-11-02 DIAGNOSIS — S93431A Sprain of tibiofibular ligament of right ankle, initial encounter: Secondary | ICD-10-CM | POA: Diagnosis not present

## 2021-11-02 DIAGNOSIS — M7751 Other enthesopathy of right foot: Secondary | ICD-10-CM | POA: Diagnosis not present

## 2021-11-02 DIAGNOSIS — M659 Synovitis and tenosynovitis, unspecified: Secondary | ICD-10-CM | POA: Diagnosis not present

## 2021-11-02 DIAGNOSIS — M25571 Pain in right ankle and joints of right foot: Secondary | ICD-10-CM | POA: Insufficient documentation

## 2021-11-02 HISTORY — DX: Gastro-esophageal reflux disease without esophagitis: K21.9

## 2021-11-02 HISTORY — PX: ANKLE ARTHROSCOPY: SHX545

## 2021-11-02 HISTORY — PX: FLEXOR TENOTOMY: SHX6342

## 2021-11-02 HISTORY — PX: ANKLE RECONSTRUCTION: SHX1151

## 2021-11-02 SURGERY — RECONSTRUCTION, ANKLE
Anesthesia: General | Site: Ankle | Laterality: Right

## 2021-11-02 MED ORDER — LIDOCAINE HCL (CARDIAC) PF 100 MG/5ML IV SOSY
PREFILLED_SYRINGE | INTRAVENOUS | Status: DC | PRN
  Administered 2021-11-02: 100 mg via INTRATRACHEAL

## 2021-11-02 MED ORDER — GLYCOPYRROLATE 0.2 MG/ML IJ SOLN
INTRAMUSCULAR | Status: DC | PRN
  Administered 2021-11-02: .2 mg via INTRAVENOUS

## 2021-11-02 MED ORDER — AMOXICILLIN-POT CLAVULANATE 500-125 MG PO TABS: 1.0000 | ORAL_TABLET | Freq: Three times a day (TID) | ORAL | 0 refills | Status: DC

## 2021-11-02 MED ORDER — FENTANYL CITRATE (PF) 100 MCG/2ML IJ SOLN
INTRAMUSCULAR | Status: DC | PRN
  Administered 2021-11-02 (×4): 50 ug via INTRAVENOUS

## 2021-11-02 MED ORDER — DEXAMETHASONE SODIUM PHOSPHATE 4 MG/ML IJ SOLN
INTRAMUSCULAR | Status: DC | PRN
  Administered 2021-11-02: 4 mg via INTRAVENOUS

## 2021-11-02 MED ORDER — DEXMEDETOMIDINE HCL IN NACL 200 MCG/50ML IV SOLN
INTRAVENOUS | Status: DC | PRN
  Administered 2021-11-02: 8 ug via INTRAVENOUS
  Administered 2021-11-02 (×2): 4 ug via INTRAVENOUS

## 2021-11-02 MED ORDER — PROPOFOL 10 MG/ML IV BOLUS
INTRAVENOUS | Status: DC | PRN
  Administered 2021-11-02: 25 ug/kg/min via INTRAVENOUS
  Administered 2021-11-02: 200 mg via INTRAVENOUS

## 2021-11-02 MED ORDER — LACTATED RINGERS IV SOLN: INTRAVENOUS | Status: DC

## 2021-11-02 MED ORDER — ASPIRIN 81 MG PO TBEC: 81.0000 mg | DELAYED_RELEASE_TABLET | Freq: Two times a day (BID) | ORAL | 0 refills | Status: AC

## 2021-11-02 MED ORDER — LIDOCAINE HCL (PF) 2 % IJ SOLN
INTRAMUSCULAR | Status: DC | PRN
  Administered 2021-11-02: 4 mg

## 2021-11-02 MED ORDER — LACTATED RINGERS IR SOLN
Status: DC | PRN
  Administered 2021-11-02: 1

## 2021-11-02 MED ORDER — ONDANSETRON HCL 4 MG/2ML IJ SOLN
INTRAMUSCULAR | Status: DC | PRN
  Administered 2021-11-02: 4 mg via INTRAVENOUS

## 2021-11-02 MED ORDER — SODIUM CHLORIDE 0.9 % IR SOLN
Status: DC | PRN
  Administered 2021-11-02: 1

## 2021-11-02 MED ORDER — CEFAZOLIN SODIUM-DEXTROSE 2-4 GM/100ML-% IV SOLN
2.0000 g | INTRAVENOUS | Status: AC
  Administered 2021-11-02: 2 g via INTRAVENOUS

## 2021-11-02 MED ORDER — ROPIVACAINE HCL 5 MG/ML IJ SOLN
INTRAMUSCULAR | Status: DC | PRN
  Administered 2021-11-02: 20 mL via PERINEURAL

## 2021-11-02 MED ORDER — MIDAZOLAM HCL 5 MG/5ML IJ SOLN
INTRAMUSCULAR | Status: DC | PRN
  Administered 2021-11-02: 2 mg via INTRAVENOUS

## 2021-11-02 MED ORDER — OXYCODONE-ACETAMINOPHEN 7.5-325 MG PO TABS: 1.0000 | ORAL_TABLET | Freq: Four times a day (QID) | ORAL | 0 refills | Status: AC | PRN

## 2021-11-02 SURGICAL SUPPLY — 48 items
ANCH SUT NDL DX FBRTK STRL LF (Anchor) ×2 IMPLANT
ANCHOR SUT FBRTK 1.3 SUTTAP (Anchor) IMPLANT
BNDG CMPR 5X4 CHSV STRCH STRL (GAUZE/BANDAGES/DRESSINGS) ×1
BNDG CMPR 75X41 PLY HI ABS (GAUZE/BANDAGES/DRESSINGS) ×1
BNDG CMPR STD VLCR NS LF 5.8X4 (GAUZE/BANDAGES/DRESSINGS) ×1
BNDG CMPR STD VLCR NS LF 5.8X6 (GAUZE/BANDAGES/DRESSINGS) ×1
BNDG COHESIVE 4X5 TAN STRL LF (GAUZE/BANDAGES/DRESSINGS) IMPLANT
BNDG ELASTIC 4X5.8 VLCR NS LF (GAUZE/BANDAGES/DRESSINGS) ×2 IMPLANT
BNDG ELASTIC 6X5.8 VLCR NS LF (GAUZE/BANDAGES/DRESSINGS) ×1 IMPLANT
BNDG ESMARK 4X12 TAN STRL LF (GAUZE/BANDAGES/DRESSINGS) ×1 IMPLANT
BNDG STRETCH 4X75 STRL LF (GAUZE/BANDAGES/DRESSINGS) IMPLANT
BUR ABRADER 2.9 BARREL PURPL (BURR) IMPLANT
BURR  ABRADER 2.9 BARREL PURPL (BURR) ×1
BURR ABRADER 2.9 BARREL PURPL (BURR) ×1
COVER LIGHT HANDLE UNIVERSAL (MISCELLANEOUS) IMPLANT
DRAPE STERI 35X30 U-POUCH (DRAPES) ×1 IMPLANT
DURAPREP 26ML APPLICATOR (WOUND CARE) ×1 IMPLANT
ETHIBOND 2 0 GREEN CT 2 30IN (SUTURE) IMPLANT
GAUZE SPONGE 4X4 12PLY STRL (GAUZE/BANDAGES/DRESSINGS) ×1 IMPLANT
GAUZE XEROFORM 1X8 LF (GAUZE/BANDAGES/DRESSINGS) ×1 IMPLANT
GLOVE BIOGEL PI IND STRL 7.5 (GLOVE) ×1 IMPLANT
GLOVE SURG POLYISO LF SZ7 (GLOVE) ×2 IMPLANT
GOWN STRL REUS W/ TWL LRG LVL3 (GOWN DISPOSABLE) ×2 IMPLANT
GOWN STRL REUS W/TWL LRG LVL3 (GOWN DISPOSABLE) ×2
IMPL INTERNAL BRACE BIO (Anchor) IMPLANT
IMPLANT INTERNAL BRACE BIO (Anchor) ×1 IMPLANT
IV LACTATED RINGER IRRG 3000ML (IV SOLUTION) ×2
IV LR IRRIG 3000ML ARTHROMATIC (IV SOLUTION) ×4 IMPLANT
IV NS 250ML (IV SOLUTION) ×1
IV NS 250ML BAXH (IV SOLUTION) IMPLANT
KIT FIBERTAK DX 1.6 DISP (KITS) IMPLANT
KIT TURNOVER KIT A (KITS) ×1 IMPLANT
MANIFOLD 4PT FOR NEPTUNE1 (MISCELLANEOUS) ×1 IMPLANT
PACK EXTREMITY ARMC (MISCELLANEOUS) ×1 IMPLANT
PADDING CAST BLEND 4X4 NS (MISCELLANEOUS) ×4 IMPLANT
RESECTOR FULL RADIUS 2.9 (ORTHOPEDIC DISPOSABLE SUPPLIES) IMPLANT
SPLINT CAST 1 STEP 4X30 (MISCELLANEOUS) ×1 IMPLANT
SPONGE T-LAP 18X18 ~~LOC~~+RFID (SPONGE) IMPLANT
STOCKINETTE TUB 6IN (MISCELLANEOUS) ×1 IMPLANT
SUT ETHILON 3-0 FS-10 30 BLK (SUTURE) ×3
SUT VIC AB 2-0 SH 27 (SUTURE) ×2
SUT VIC AB 2-0 SH 27XBRD (SUTURE) IMPLANT
SUT VIC AB 3-0 SH 27 (SUTURE) ×1
SUT VIC AB 3-0 SH 27X BRD (SUTURE) IMPLANT
SUTURE EHLN 3-0 FS-10 30 BLK (SUTURE) IMPLANT
TUBING INFLOW SET DBFLO PUMP (TUBING) ×1 IMPLANT
TUBING OUTFLOW SET DBLFO PUMP (TUBING) ×1 IMPLANT
WAND TOPAZ MICRO DEBRIDER (MISCELLANEOUS) IMPLANT

## 2021-11-02 NOTE — H&P (Signed)
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 Anterior ankle joint impingement, right.  The various methods of treatment have been discussed with the patient.  No guarantees were given.  After consideration of risks, benefits and other options for treatment, the patient has consented to surgery.  I have reviewed the patients' chart and labs.    PROCEDURE: ALL RIGHT ANKLE LATERAL ANKLE LIGAMENT REPAIR/STABILIZATION - BROSTROM-GOULD WITH POSSIBLE INTERNAL BRACE PERONEAL TENDON REPAIR ANKLE ARTHROSCOPY WITH EXTENSIVE DEBRIDEMENT AND REMOVAL OF ANKLE ANKLE BONE SPUR  A history and physical examination was performed in my office.  The patient was reexamined.  There have been no changes to this history and physical examination.  Caroline More, DPM

## 2021-11-02 NOTE — Op Note (Signed)
PODIATRY / FOOT AND ANKLE SURGERY OPERATIVE REPORT    SURGEON: Rosetta PosnerAndrew Quentin Shorey, DPM  PRE-OPERATIVE DIAGNOSIS: All right ankle 1.  Ankle arthritis and synovitis 2.  Peroneal tendinitis with peroneal tendon tear 3.  Lateral ankle instability with attenuation/rupture of ATFL ankle ligament 4.  Anterior ankle impingement syndrome  POST-OPERATIVE DIAGNOSIS: Same  PROCEDURE(S): All right ankle Right ankle arthroscopy with extensive debridement Right microfracture talus osteochondral defect Right lateral ankle stabilization - Brostrm Gould procedure with internal brace Right peroneus brevis tendon repair with tenosynovectomy  HEMOSTASIS: Right thigh tourniquet  ANESTHESIA: general  ESTIMATED BLOOD LOSS: 30 cc  FINDING(S): 1.  Large anterior ankle joint spur at the anterior lateral aspect of the right ankle 2.  Articular cartilage damage and osteoarthritis present throughout the right ankle especially at the anterior lateral talar dome 3.  Severe attenuation of the anterior talofibular ligament and capsular tissue with severe instability 4.  Severe tear of peroneus brevis tendon with large amount of tenosynovitis  PATHOLOGY/SPECIMEN(S): None  INDICATIONS:   Christy Watson is a 61 y.o. female who presents with chronic pain to the right ankle.  Patient mainly complains of pain at the anterior lateral/anterior aspect of the ankle joint but also has some pain laterally as well.  She recently saw a different physician at [facility] and had MRI imaging taken which showed severe attenuation of the ATFL ankle ligament, peroneal tendinitis with likely peroneal tendon tear, and ankle joint arthritis with anterior ankle joint spur.  Imaging was reviewed and discussed with patient in detail.  All treatment options were discussed with the patient both conservative and surgical attempts at correction including potential risks and complications at this time patient is elected for surgical  intervention consisting of right ankle lateral stabilization, peroneal tendon repair, ankle arthroscopy with extensive debridement.  All questions answered, no guarantees given.  Consent obtained prior to procedure.  Discussed long-term the patient does have ankle arthritis may require subsequent surgery if continues to have pain discomfort consisting of ankle fusion or total ankle replacement.  Patient understands elects to proceed today.  DESCRIPTION: After obtaining full informed written consent, the patient was brought back to the operating room and placed supine upon the operating table.  The patient received IV antibiotics prior to induction.  After obtaining adequate anesthesia, the patient was prepped and draped in the standard fashion.  A popliteal/saphenous nerve block was performed by anesthesia preoperatively.  An Esmarch bandage was used to exsanguinate the right lower extremity and pneumatic thigh tourniquet was inflated.  Attention was directed to the anterior ankle where 10 cc of lactated Ringer's was injected into the ankle joint through the anterior medial portal medial to the tendon of the tibialis anterior.  A small percutaneous incision was made to this area and hemostat was used to dissect down to the ankle joint and the ankle joint capsule was punctured and a rush of fluid was seen indicating successful entry to the ankle joint.  At this time the obturator and cannula was placed into the ankle and the obturator was removed and the scope was placed.  The ankle was insufflated and inspected.  There appeared to be a large bone spur at the anterior lateral aspect of the ankle joint inhibiting some of the range of motion of this area and there also appeared to be articular cartilage damage to this area.  There appeared to be a large amount of scar tissue and synovitis present within the ankle joint at  the anterior lateral aspect and lateral aspect around the fibula.  The medial aspect appeared  to be fairly healthy with fair amount of articular cartilage intact and no obvious spurring or synovitis.  The anterior lateral border was then created through transillumination with 15 blade and blunt dissection was continued down to the ankle joint and the ankle was then punctured with a hemostat and a rush of fluid was seen indicating successful entry.  The shaver was then placed into the ankle joint.  The shaver was then used to remove any synovitic tissue present to the medial, anterior, and lateral aspects of the ankle joint.  There once again appeared to be a large bone spur present at the anterior lateral aspect of the ankle joint inhibiting some of the range of motion of the talus and began to the talar articular cartilage.  At this time the rotary bur instrumentation was then placed through the anterior lateral portal and the bone spur was shaved off this area.  A grasper was used to remove some of the big sections of bone to this area.  The talus appeared to be moving more smoothly after this was removed and did not appear to have any impingement after that.  The articular cartilage appeared to be damaged and somewhat soft at the anterior lateral talus so at this time it was determined to perform some mild microfracturing of the area.  There appeared to be minimal articular cartilage left the area after removal of the spur due to chronic rubbing to the area.  At this time the microfracture pick was then placed over the site and for microfracture small pinpoint holes were made into the talus articular cartilage area through the subchondral bone.  Some of the marrow was able to be seen indicative of the pit going deep enough, to promote fibrocartilage growth.  The instrumentation was then switched and the shaver was placed at the medial portal and the camera was placed through the lateral portal and further debridement was performed of any synovitic tissue remaining to the anterior and medial aspects of the  ankle joint.  The instrumentation was then removed after the ankle joint was exsanguinated.  The 2 incisions that were made were reapproximated well coapted with 3-0 nylon.  Attention was directed to the lateral ankle area at the posterior lateral aspect of the fibula and an ankle incision was made from this area extending over the distal fibula into the sinus tarsi area.  The incision was deepened through the subcutaneous tissues utilizing sharp and blunt dissection care was taken to identify and retract all vital neurovascular structures no venous contributories were cauterized as necessary.  The sural nerve was identified and retracted to the posterior lateral aspect of the ankle throughout the remainder the case.  Some of the sinus tarsi adipose tissue was resected and passed off the operative site to visualize the extensor retinaculum and lateral ankle joint capsule.  The peroneal tendon sheath was also able to be visualized.  At this time the peroneal tendon sheath was transected and the peroneal tendons were examined at the lateral ankle and posterior ankle area.  The peroneus brevis tendon appeared to have a large split thickness tear and appeared to be very fanned out with attenuated tissue.  There appeared to be a large amount of tenosynovitis around the peroneus brevis and longus tendons.  A tenosynovectomy was performed removing all of the inflamed tissue around the peroneal tendons.  The peroneus longus tendon was visualized  and appeared to have a fairly normal morphology.  At this time the Topaz instrumentation was used to debride the peroneus brevis tendon.  A 2-0 Ethibond was used in a running interconnected stitch to be tubularized the peroneus brevis tendon.  The peroneal tendons were then placed back behind the posterior lateral ankle and brought through range of motion noted to glide excellently.  The peroneal retinaculum was reapproximated well coapted with 2-0 Ethibond.  The peroneal tendon  sheath was then reapproximated well coapted with 2-0 Vicryl in a running interconnected type stitch.  Attention was then directed to the anterior lateral ankle where a full-thickness cuff of tissue was raised off of the distal fibula.  The lateral wall of the talus was able to be visualized along with the lateral process.  The ankle joint was inspected to this area and appeared to have an adequate resection of the bone spur that was present.  Rongeur was used to this area to remove a little bit of bony debris that was present to this area.  The ankle joint was flushed out further.  Range of motion was then tested and the ankle joint appeared to have smooth range of motion with no palpable clicking.  The tissue at the anterior lateral ankle where the ATFL would be appeared to be very attenuated and thickened.  At this time the Arthrex internal brace system was prepared and the anchor was first placed into the talus with the appropriate seating under fluoroscopic guidance which appeared to sit excellently.  2 fiber tack anchors were then placed into the distal fibula.  The suture was then brought through the remnant ATFL tissue and lateral ankle joint capsule in a pants over vest type stitch.  The 2 anchors were then tied completing the Brostrm portion of the repair.  A bone tunnel was then created slightly proximal to this area and to the distal fibula superior to the fiber tacks.  This was also done under fluoroscopic guidance which appeared to be in the appropriate position.  The suture was then brought from the talar component to the fibular component with the anchor system.  The foot was held in a slightly plantarflexed position and appropriate tension was applied.  The area was marked on the suture for application of the anchor.  The anchor was then placed into the fibula with holding the appropriate tension.  The sutures were then cut to the bony insertions.  The ankle was then brought through range of  motion noted to have stable inversion, greatly improved compared to laxity preoperatively and anterior drawer sign negative.  Reinforcement of the repair was performed suturing the fibular cuff of tissue into the extensor retinaculum completing the Brostrm portion of the repair with 2-0 Vicryl.  The surgical site was flushed with copious amounts normal sterile saline.  The subcutaneous tissue was then reapproximated well coapted with 3-0 Vicryl and the skin was then reapproximated well coapted with 3-0 nylon in a combination of simple and horizontal mattress type stitching.  The pneumatic thigh tourniquet was deflated prior to closing of the incision and a prompt hyperemic response was present to all digits of the right foot.  Hemostasis appeared to be well achieved.  A postoperative dressing was then applied consisting of Xeroform to the incision lines followed by 4 x 4 gauze, Kerlix, Webril, posterior splint, Ace wrap.  The patient tolerated the procedure and anesthesia well was transferred to recovery vital signs stable vascular status intact well to the right  foot.  The patient will be discharged with the appropriate orders and instructions as well as medications after period of postoperative monitoring.  Discussed findings with patient's family in detail postoperatively, patient appeared to have substantial arthritis to the ankle joint as well as unstable ankle due to the lateral ligament issues and a large peroneal tendon tear.  Discussed long-term ramifications of this.  COMPLICATIONS: None  CONDITION: Good, stable  Rosetta Posner, DPM

## 2021-11-02 NOTE — Anesthesia Postprocedure Evaluation (Signed)
Anesthesia Post Note  Patient: Christy Watson  Procedure(s) Performed: [place] (Right: Ankle) ANKLE ARTHROSCOPY (Right: Ankle) FLEXOR TENDON REPAIR (Right: Ankle)     Patient location during evaluation: PACU Anesthesia Type: General Level of consciousness: awake and alert Pain management: pain level controlled Vital Signs Assessment: post-procedure vital signs reviewed and stable Respiratory status: spontaneous breathing, nonlabored ventilation and respiratory function stable Cardiovascular status: blood pressure returned to baseline and stable Postop Assessment: no apparent nausea or vomiting Anesthetic complications: no   There were no known notable events for this encounter.  Iran Ouch

## 2021-11-02 NOTE — Anesthesia Procedure Notes (Signed)
Anesthesia Regional Block: Popliteal block   Pre-Anesthetic Checklist: , timeout performed,  Correct Patient, Correct Site, Correct Laterality,  Correct Procedure, Correct Position, site marked,  Risks and benefits discussed,  Surgical consent,  Pre-op evaluation,  At surgeon's request and post-op pain management  Laterality: Right  Prep: chloraprep       Needles:  Injection technique: Single-shot  Needle Type: Stimiplex     Needle Length: 9cm  Needle Gauge: 22     Additional Needles:   Procedures:,,,, ultrasound used (permanent image in chart),,    Narrative:  Start time: 11/02/2021 7:15 AM End time: 11/02/2021 7:20 AM Injection made incrementally with aspirations every 5 mL.  Performed by: Personally  Anesthesiologist: Iran Ouch, MD  Additional Notes: Patient consented for risk and benefits of nerve block including but not limited to nerve damage, failed block, bleeding and infection.  Patient voiced understanding.  Functioning IV was confirmed and monitors were applied.  Timeout done prior to procedure and prior to any sedation being given to the patient.  Patient confirmed procedure site prior to any sedation given to the patient.  A 47mm 22ga Stimuplex needle was used. Sterile prep,hand hygiene and sterile gloves were used.  Minimal sedation used for procedure.  No paresthesia endorsed by patient during the procedure.  Negative aspiration and negative test dose prior to incremental administration of local anesthetic. The patient tolerated the procedure well with no immediate complications.

## 2021-11-02 NOTE — Transfer of Care (Signed)
Immediate Anesthesia Transfer of Care Note  Patient: Christy Watson  Procedure(s) Performed: [name] (Right: Ankle) ANKLE ARTHROSCOPY (Right: Ankle) FLEXOR TENDON REPAIR (Right: Ankle)  Patient Location: PACU  Anesthesia Type: General  Level of Consciousness: drowsy  Airway and Oxygen Therapy: Patient Spontanous Breathing and Patient connected to supplemental oxygen  Post-op Assessment: Post-op Vital signs reviewed, Patient's Cardiovascular Status Stable, Respiratory Function Stable, Patent Airway and No signs of Nausea or vomiting  Post-op Vital Signs: Reviewed and stable  Complications: There were no known notable events for this encounter.

## 2021-11-02 NOTE — Progress Notes (Signed)
Assisted Daron Offer MDA with right, popliteal, ultrasound guided block. Side rails up, monitors on throughout procedure. See vital signs in flow sheet. Tolerated Procedure well.

## 2021-11-02 NOTE — Anesthesia Procedure Notes (Signed)
Procedure Name: LMA Insertion Date/Time: 11/02/2021 8:00 AM  Performed by: Patience Musca., CRNAPre-anesthesia Checklist: Patient identified, Emergency Drugs available, Suction available, Patient being monitored and Timeout performed Patient Re-evaluated:Patient Re-evaluated prior to induction Oxygen Delivery Method: Circle system utilized Preoxygenation: Pre-oxygenation with 100% oxygen Induction Type: IV induction Ventilation: Mask ventilation without difficulty LMA: LMA inserted LMA Size: 4.0 Number of attempts: 1

## 2021-11-03 ENCOUNTER — Encounter: Payer: Self-pay | Admitting: Podiatry

## 2021-11-17 DIAGNOSIS — M25371 Other instability, right ankle: Secondary | ICD-10-CM | POA: Diagnosis not present

## 2021-11-17 DIAGNOSIS — M25879 Other specified joint disorders, unspecified ankle and foot: Secondary | ICD-10-CM | POA: Diagnosis not present

## 2021-12-27 DIAGNOSIS — M25571 Pain in right ankle and joints of right foot: Secondary | ICD-10-CM | POA: Diagnosis not present

## 2021-12-27 DIAGNOSIS — M25471 Effusion, right ankle: Secondary | ICD-10-CM | POA: Diagnosis not present

## 2022-01-03 DIAGNOSIS — M25471 Effusion, right ankle: Secondary | ICD-10-CM | POA: Diagnosis not present

## 2022-01-03 DIAGNOSIS — M25571 Pain in right ankle and joints of right foot: Secondary | ICD-10-CM | POA: Diagnosis not present

## 2022-01-10 DIAGNOSIS — R29898 Other symptoms and signs involving the musculoskeletal system: Secondary | ICD-10-CM | POA: Diagnosis not present

## 2022-01-17 DIAGNOSIS — R29898 Other symptoms and signs involving the musculoskeletal system: Secondary | ICD-10-CM | POA: Diagnosis not present

## 2022-01-24 DIAGNOSIS — R29898 Other symptoms and signs involving the musculoskeletal system: Secondary | ICD-10-CM | POA: Diagnosis not present

## 2022-01-31 DIAGNOSIS — R29898 Other symptoms and signs involving the musculoskeletal system: Secondary | ICD-10-CM | POA: Diagnosis not present

## 2022-02-08 DIAGNOSIS — R29898 Other symptoms and signs involving the musculoskeletal system: Secondary | ICD-10-CM | POA: Diagnosis not present

## 2022-02-14 DIAGNOSIS — M898X7 Other specified disorders of bone, ankle and foot: Secondary | ICD-10-CM | POA: Diagnosis not present

## 2022-02-14 DIAGNOSIS — S93401D Sprain of unspecified ligament of right ankle, subsequent encounter: Secondary | ICD-10-CM | POA: Diagnosis not present

## 2022-02-14 DIAGNOSIS — M19071 Primary osteoarthritis, right ankle and foot: Secondary | ICD-10-CM | POA: Diagnosis not present

## 2022-02-14 DIAGNOSIS — M25879 Other specified joint disorders, unspecified ankle and foot: Secondary | ICD-10-CM | POA: Diagnosis not present

## 2022-03-30 DIAGNOSIS — Z1211 Encounter for screening for malignant neoplasm of colon: Secondary | ICD-10-CM | POA: Diagnosis not present

## 2022-04-30 DIAGNOSIS — M898X7 Other specified disorders of bone, ankle and foot: Secondary | ICD-10-CM | POA: Diagnosis not present

## 2022-04-30 DIAGNOSIS — S93401D Sprain of unspecified ligament of right ankle, subsequent encounter: Secondary | ICD-10-CM | POA: Diagnosis not present

## 2022-04-30 DIAGNOSIS — M19071 Primary osteoarthritis, right ankle and foot: Secondary | ICD-10-CM | POA: Diagnosis not present

## 2022-04-30 DIAGNOSIS — M25879 Other specified joint disorders, unspecified ankle and foot: Secondary | ICD-10-CM | POA: Diagnosis not present

## 2022-05-17 ENCOUNTER — Ambulatory Visit: Payer: Self-pay

## 2022-05-17 DIAGNOSIS — S61211A Laceration without foreign body of left index finger without damage to nail, initial encounter: Secondary | ICD-10-CM | POA: Diagnosis not present

## 2022-05-17 DIAGNOSIS — R030 Elevated blood-pressure reading, without diagnosis of hypertension: Secondary | ICD-10-CM | POA: Diagnosis not present

## 2022-05-17 DIAGNOSIS — Z23 Encounter for immunization: Secondary | ICD-10-CM | POA: Diagnosis not present

## 2022-08-01 DIAGNOSIS — M898X7 Other specified disorders of bone, ankle and foot: Secondary | ICD-10-CM | POA: Diagnosis not present

## 2022-08-01 DIAGNOSIS — M25879 Other specified joint disorders, unspecified ankle and foot: Secondary | ICD-10-CM | POA: Diagnosis not present

## 2022-08-01 DIAGNOSIS — M19071 Primary osteoarthritis, right ankle and foot: Secondary | ICD-10-CM | POA: Diagnosis not present

## 2022-08-01 DIAGNOSIS — S93401D Sprain of unspecified ligament of right ankle, subsequent encounter: Secondary | ICD-10-CM | POA: Diagnosis not present

## 2022-09-12 NOTE — Progress Notes (Unsigned)
New patient visit   Patient: Christy Watson   DOB: 06-01-1960   62 y.o. Female  MRN: [id number] Visit Date: 09/13/2022  Today's healthcare provider: Ronnald Ramp, MD   No chief complaint on file.  Subjective    Christy Watson is a 62 y.o. female who presents today as a new patient to establish care.      Pertinent PMHX   Last annual physical: ***   *** Medications: ***   ***  Medications: ***    Health Maintenance ***  Concerns for Today:   ***:   Past Medical History:  Diagnosis Date   Allergy    Depression    GERD (gastroesophageal reflux disease)    Hypercholesterolemia    Vertigo 2014   Past Surgical History:  Procedure Laterality Date   ANKLE ARTHROSCOPY Right 11/02/2021   Procedure: ANKLE ARTHROSCOPY;  Surgeon: Rosetta Posner, DPM;  Location: [facility];  Service: Podiatry;  Laterality: Right;   ANKLE RECONSTRUCTION Right 11/02/2021   Procedure: Nicoletta Dress - GOULD;  Surgeon: Rosetta Posner, DPM;  Location: [facility];  Service: Podiatry;  Laterality: Right;  Anesthesia: Choice - preop pop block   CHOLECYSTECTOMY  2010   FLEXOR TENOTOMY  Right 11/02/2021   Procedure: FLEXOR TENDON REPAIR;  Surgeon: Rosetta Posner, DPM;  Location: [facility];  Service: Podiatry;  Laterality: Right;   Family Status  Relation Name Status   Mother  Alive   Father  Deceased   Sister  Alive   Brother  Chemical engineer  (Not Specified)   MGM  Deceased   MGF  (Not Specified)   PGM  Deceased   PGF  Deceased  No partnership data on file   Family History  Problem Relation Age of Onset   Autoimmune disease Mother    Diabetes Father    CVA Father    Hyperlipidemia Father    Thyroid disease Sister    Autoimmune disease Sister    Hyperlipidemia Brother    Hypertension Brother    Graves' disease Brother    Thyroid disease Maternal Aunt    Bone cancer Maternal Grandfather    Breast cancer Paternal Grandmother 57   Brain cancer  Paternal Grandfather 24   Social History   Socioeconomic History   Marital status: Married    Spouse name: Not on file   Number of children: Not on file   Years of education: Not on file   Highest education level: Not on file  Occupational History   Not on file  Tobacco Use   Smoking status: Never   Smokeless tobacco: Never  Vaping Use   Vaping status: Never Used  Substance and Sexual Activity   Alcohol use: Yes    Comment: occasional   Drug use: No   Sexual activity: Not Currently    Birth control/protection: Post-menopausal  Other Topics Concern   Not on file  Social History Narrative   Not on file   Social Determinants of Health   Financial Resource Strain: Not on file  Food Insecurity: Not on file  Transportation Needs: Not on file  Physical Activity: Unknown (07/15/2017)   Exercise Vital Sign    Days of Exercise per Week: 0 days    Minutes of Exercise per Session: Not on file  Stress: Not on file  Social Connections: Not on file    Outpatient Medications Prior to Visit  Medication Sig   amoxicillin-clavulanate (AUGMENTIN) 500-125 MG tablet Take 1 tablet (  500 mg total) by mouth 3 (three) times daily.   Ascorbic Acid (VITAMIN C PO) Take by mouth.   busPIRone (BUSPAR) 10 MG tablet Take 1 tablet (10 mg total) by mouth 2 (two) times daily. (Patient taking differently: Take 10 mg by mouth daily as needed.)   omeprazole (PRILOSEC) 20 MG capsule Take 1 capsule (20 mg total) by mouth daily before breakfast.   rosuvastatin (CRESTOR) 20 MG tablet Take 1 tablet (20 mg total) by mouth daily.   venlafaxine XR (EFFEXOR-XR) 75 MG 24 hr capsule TAKE 1 CAPSULE BY MOUTH  DAILY WITH BREAKFAST   No facility-administered medications prior to visit.   Allergies  Allergen Reactions   Sulfa Antibiotics Hives    Immunization History  Administered Date(s) Administered   Influenza Split 12/03/2013   Influenza,inj,Quad PF,6+ Mos 12/26/2017   Influenza-Unspecified 12/03/2013   Tdap  12/03/2013, 04/17/2020   Zoster Recombinant(Shingrix) 03/28/2018, 05/28/2018    Health Maintenance  Topic Date Due   HIV Screening  Never done   Hepatitis C Screening  Never done   COVID-19 Vaccine (1 - 2023-24 season) Never done   INFLUENZA VACCINE  09/13/2022   MAMMOGRAM  08/01/2023   PAP SMEAR-Modifier  09/14/2024   DTaP/Tdap/Td (3 - Td or Tdap) 04/18/2030   Colonoscopy  04/15/2032   Zoster Vaccines- Shingrix  Completed   HPV VACCINES  Aged Out    Patient Care Team: Carren Rang, PA-C as PCP - General (Physician Assistant)  Review of Systems  {Insert previous labs (optional):23779}  {See past labs  Heme  Chem  Endocrine  Serology  Results Review (optional):1}   Objective    There were no vitals taken for this visit. {Insert last BP/Wt (optional):23777}  {See vitals history (optional):1}   Depression Screen    07/28/2020    8:01 AM 01/21/2020    8:25 AM 05/04/2019    1:49 PM 03/28/2018    8:32 AM  PHQ 2/9 Scores  PHQ - 2 Score 0 2 2 0  PHQ- 9 Score 1 8 5 2    No results found for any visits on 09/13/22.   Physical Exam ***    Assessment & Plan      Problem List Items Addressed This Visit   None     No follow-ups on file.      Ronnald Ramp, MD  [facility] [phone] (phone) [phone] (fax)  [facility]

## 2022-09-13 ENCOUNTER — Ambulatory Visit (INDEPENDENT_AMBULATORY_CARE_PROVIDER_SITE_OTHER): Payer: BC Managed Care – PPO | Admitting: Family Medicine

## 2022-09-13 ENCOUNTER — Encounter: Payer: Self-pay | Admitting: Family Medicine

## 2022-09-13 VITALS — BP 136/81 | HR 93 | Temp 98.2°F | Resp 12 | Ht 66.0 in | Wt 224.0 lb

## 2022-09-13 DIAGNOSIS — I10 Essential (primary) hypertension: Secondary | ICD-10-CM

## 2022-09-13 DIAGNOSIS — Z6836 Body mass index (BMI) 36.0-36.9, adult: Secondary | ICD-10-CM | POA: Diagnosis not present

## 2022-09-13 DIAGNOSIS — K219 Gastro-esophageal reflux disease without esophagitis: Secondary | ICD-10-CM | POA: Diagnosis not present

## 2022-09-13 DIAGNOSIS — R202 Paresthesia of skin: Secondary | ICD-10-CM

## 2022-09-13 DIAGNOSIS — Z114 Encounter for screening for human immunodeficiency virus [HIV]: Secondary | ICD-10-CM | POA: Diagnosis not present

## 2022-09-13 DIAGNOSIS — Z1159 Encounter for screening for other viral diseases: Secondary | ICD-10-CM | POA: Diagnosis not present

## 2022-09-13 DIAGNOSIS — Z0000 Encounter for general adult medical examination without abnormal findings: Secondary | ICD-10-CM | POA: Insufficient documentation

## 2022-09-13 DIAGNOSIS — F320 Major depressive disorder, single episode, mild: Secondary | ICD-10-CM

## 2022-09-13 DIAGNOSIS — M545 Low back pain, unspecified: Secondary | ICD-10-CM | POA: Diagnosis not present

## 2022-09-13 DIAGNOSIS — M7541 Impingement syndrome of right shoulder: Secondary | ICD-10-CM

## 2022-09-13 DIAGNOSIS — R5383 Other fatigue: Secondary | ICD-10-CM | POA: Diagnosis not present

## 2022-09-13 DIAGNOSIS — E782 Mixed hyperlipidemia: Secondary | ICD-10-CM | POA: Diagnosis not present

## 2022-09-13 DIAGNOSIS — Z1231 Encounter for screening mammogram for malignant neoplasm of breast: Secondary | ICD-10-CM

## 2022-09-13 DIAGNOSIS — R053 Chronic cough: Secondary | ICD-10-CM

## 2022-09-13 MED ORDER — VENLAFAXINE HCL ER 75 MG PO CP24: ORAL_CAPSULE | ORAL | 3 refills | Status: AC

## 2022-09-13 MED ORDER — ROSUVASTATIN CALCIUM 20 MG PO TABS: 20.0000 mg | ORAL_TABLET | Freq: Every day | ORAL | 3 refills | Status: AC

## 2022-09-13 MED ORDER — FLUTICASONE PROPIONATE HFA 110 MCG/ACT IN AERO: 2.0000 | INHALATION_SPRAY | Freq: Two times a day (BID) | RESPIRATORY_TRACT | 4 refills | Status: DC

## 2022-09-13 MED ORDER — OMEPRAZOLE 20 MG PO CPDR: 20.0000 mg | DELAYED_RELEASE_CAPSULE | Freq: Every day | ORAL | 3 refills | Status: AC

## 2022-09-13 NOTE — Assessment & Plan Note (Signed)
The patient reports a chronic cough that is relieved by using an expired inhaler. No associated wheezing, shortness of breath, or chest tightness. 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 -Chronic, intermittent, no symptoms today  -Consider pulmonary function tests or referral to pulmonology if cough persists. -continue to use Flovent 2 puffs daily  inhaler for cough

## 2022-09-13 NOTE — Assessment & Plan Note (Signed)
Chronic pain in multiple joints including the right shoulder, ankle, knee, and bilateral hips. The pain is severe enough to interfere with sleep. The patient has a history of arthritis and has undergone surgery for the ankle. Currently using Aleve at night for pain management. -Chronic, worsening  -continue NSAIDS, rest and other conservative measures -Refer to orthopedics for further evaluation and management.

## 2022-09-13 NOTE — Assessment & Plan Note (Signed)
The patient reports that her blood pressure has been higher than usual recently, possibly related to weight gain. -Chronic  -within normal limits but not at goal of less than 130/80  -CMP and TSH ordered today  -no current antihypertensives prescribed  -Continue current management and monitor blood pressure closely.

## 2022-09-13 NOTE — Assessment & Plan Note (Signed)
Chronic, BMI 36.15  Filed Weights   09/13/22 1318  Weight: 224 lb (101.6 kg)    No detailed exercise regimen discussed today  Recommended balanced diet including protein, limiting saturated fats and consuming sugary beverages and desserts in moderation, recommended decreasing carb intake to help with steatosis reported

## 2022-09-13 NOTE — Assessment & Plan Note (Signed)
The patient is currently on rosuvastatin 20mg  daily. The last lipid panel was done a year and a half ago. -Chronic  -continue rosuvastatin 20mg  daily  -Order a lipid panel to assess current cholesterol levels.

## 2022-09-13 NOTE — Assessment & Plan Note (Signed)
HIV screening ordered today  

## 2022-09-13 NOTE — Assessment & Plan Note (Signed)
The patient reports frequent tingling in the right hand, occasionally in the left hand and feet. The patient works on a computer all day. -Acute, intermittent, asymptomatic now -Consider nerve conduction studies or referral to neurology if symptoms persist.

## 2022-09-13 NOTE — Assessment & Plan Note (Signed)
The patient is currently on venlafaxine 75mg  for depression and has a history of using Buspar for anxiety. The patient reports feeling jittery and nervous. -Chronic, stable on medication  -Continue venlafaxine 75mg  for depression. -Consider resuming Buspar for anxiety, advise patient to take with water due to pill coating issue.

## 2022-09-13 NOTE — Assessment & Plan Note (Signed)
Hepatitis C screening ordered today  

## 2022-09-13 NOTE — Assessment & Plan Note (Signed)
Chronic  Stable  Referred to orthopedics Continue NSAIDS  S/p PT for this problem

## 2022-09-13 NOTE — Patient Instructions (Signed)
VISIT SUMMARY:  During your visit, we discussed your chronic joint pain, chronic cough, high blood pressure, high cholesterol, anxiety and depression, tingling in your hands and feet, and heartburn. We also discussed your general health maintenance.  YOUR PLAN:  -CHRONIC JOINT PAIN: You have severe pain in multiple joints, including your right shoulder, ankle, knee, and both hips. We will refer you to an orthopedic specialist for further evaluation and possible physical therapy.  -CHRONIC COUGH: You have a persistent cough that is relieved by using an inhaler. We may consider lung function tests or a referral to a lung specialist if your cough continues.  -HIGH BLOOD PRESSURE: Your blood pressure has been higher than usual, possibly due to weight gain. We will continue to monitor your blood pressure closely.  -HIGH CHOLESTEROL: You are currently taking medication for high cholesterol. We will order a blood test to check your current cholesterol levels.  -ANXIETY AND DEPRESSION: You are currently taking medication for depression and have a history of anxiety. We will continue your current medication and consider resuming medication for anxiety.  -TINGLING IN HANDS AND FEET: You have been experiencing frequent tingling in your right hand, and occasionally in your left hand and feet. We may consider nerve tests or a referral to a neurologist if these symptoms continue.  -HEARTBURN: You have been experiencing worsening heartburn and are currently taking medication for it. We will continue your current medication and consider lifestyle changes to help manage your symptoms.  -GENERAL HEALTH MAINTENANCE: We will order a mammogram for routine breast cancer screening. We will also follow up in 4-6 weeks to reassess your joint pain and other ongoing issues.  INSTRUCTIONS:  Please schedule a follow-up appointment in 4-6 weeks. In the meantime, please contact the office if your symptoms worsen or if you  have any concerns.

## 2022-09-13 NOTE — Assessment & Plan Note (Signed)
Chronic  Worsening  CMP, CBC, TSH, A1c, ferritin ordered

## 2022-09-13 NOTE — Assessment & Plan Note (Signed)
The patient reports worsening heartburn and is currently on omeprazole 20mg . -Chronic, increasing symptoms  -Continue omeprazole 20mg  and consider lifestyle modifications.

## 2022-09-13 NOTE — Assessment & Plan Note (Signed)
Mammogram ordered  Declined COVID and influenza vaccines

## 2022-09-19 ENCOUNTER — Other Ambulatory Visit: Payer: Self-pay | Admitting: Family Medicine

## 2022-09-19 ENCOUNTER — Other Ambulatory Visit: Payer: Self-pay

## 2022-09-19 MED ORDER — PULMICORT FLEXHALER 90 MCG/ACT IN AEPB: 1.0000 | INHALATION_SPRAY | Freq: Two times a day (BID) | RESPIRATORY_TRACT | 6 refills | Status: DC

## 2022-09-25 ENCOUNTER — Ambulatory Visit: Payer: Self-pay | Admitting: Family Medicine

## 2022-09-26 ENCOUNTER — Other Ambulatory Visit (HOSPITAL_BASED_OUTPATIENT_CLINIC_OR_DEPARTMENT_OTHER): Payer: Self-pay

## 2022-09-26 ENCOUNTER — Ambulatory Visit (INDEPENDENT_AMBULATORY_CARE_PROVIDER_SITE_OTHER): Payer: BC Managed Care – PPO | Admitting: Student

## 2022-09-26 ENCOUNTER — Encounter (HOSPITAL_BASED_OUTPATIENT_CLINIC_OR_DEPARTMENT_OTHER): Payer: Self-pay | Admitting: Orthopaedic Surgery

## 2022-09-26 ENCOUNTER — Other Ambulatory Visit (HOSPITAL_BASED_OUTPATIENT_CLINIC_OR_DEPARTMENT_OTHER): Payer: Self-pay | Admitting: Student

## 2022-09-26 ENCOUNTER — Ambulatory Visit (INDEPENDENT_AMBULATORY_CARE_PROVIDER_SITE_OTHER): Payer: BC Managed Care – PPO

## 2022-09-26 DIAGNOSIS — M47816 Spondylosis without myelopathy or radiculopathy, lumbar region: Secondary | ICD-10-CM | POA: Diagnosis not present

## 2022-09-26 DIAGNOSIS — M5441 Lumbago with sciatica, right side: Secondary | ICD-10-CM

## 2022-09-26 DIAGNOSIS — R52 Pain, unspecified: Secondary | ICD-10-CM

## 2022-09-26 DIAGNOSIS — G8929 Other chronic pain: Secondary | ICD-10-CM

## 2022-09-26 DIAGNOSIS — M25552 Pain in left hip: Secondary | ICD-10-CM

## 2022-09-26 DIAGNOSIS — M545 Low back pain, unspecified: Secondary | ICD-10-CM

## 2022-09-26 DIAGNOSIS — M419 Scoliosis, unspecified: Secondary | ICD-10-CM | POA: Diagnosis not present

## 2022-09-26 DIAGNOSIS — M5442 Lumbago with sciatica, left side: Secondary | ICD-10-CM

## 2022-09-26 DIAGNOSIS — M25551 Pain in right hip: Secondary | ICD-10-CM

## 2022-09-26 DIAGNOSIS — M4316 Spondylolisthesis, lumbar region: Secondary | ICD-10-CM | POA: Diagnosis not present

## 2022-09-26 DIAGNOSIS — M160 Bilateral primary osteoarthritis of hip: Secondary | ICD-10-CM | POA: Diagnosis not present

## 2022-09-26 MED ORDER — METHOCARBAMOL 500 MG PO TABS
500.0000 mg | ORAL_TABLET | Freq: Three times a day (TID) | ORAL | 0 refills | Status: AC | PRN
  Filled 2022-09-26: qty 42, 14d supply, fill #0

## 2022-09-27 DIAGNOSIS — M25552 Pain in left hip: Secondary | ICD-10-CM

## 2022-09-27 MED ORDER — LIDOCAINE HCL 1 % IJ SOLN
4.0000 mL | INTRAMUSCULAR | Status: AC | PRN
  Administered 2022-09-27: 4 mL

## 2022-09-27 MED ORDER — TRIAMCINOLONE ACETONIDE 40 MG/ML IJ SUSP
2.0000 mL | INTRAMUSCULAR | Status: AC | PRN
  Administered 2022-09-27: 2 mL via INTRA_ARTICULAR

## 2022-09-27 NOTE — Progress Notes (Signed)
Chief Complaint: Bilateral hip pain     History of Present Illness:    Christy Watson is a 62 y.o. female presenting today for evaluation of pain in both hips, left worse than right.  She does have history of hip pain since her teens, however pain is now been ongoing for several months without any known injury.  Pain is located around both posterior hips, which she reports travels down around the outside of the hips towards her knees but no further.  Denies any groin pain.  Only has mild pain in the low back.  She has had difficulty being able to sleep on her left side.  She sits down all day for work and tries to only stretch when possible.  Does know what feels like muscle spasms occasionally down the left leg.  She has tried Advil and Tylenol PM, IcyHot, and massage.  No previous injections or physical therapy.   Surgical History:   None  PMH/PSH/Family History/Social History/Meds/Allergies:    Past Medical History:  Diagnosis Date   Allergy    Depression    GERD (gastroesophageal reflux disease)    Hypercholesterolemia    Vertigo 2014   Past Surgical History:  Procedure Laterality Date   ANKLE ARTHROSCOPY Right 11/02/2021   Procedure: ANKLE ARTHROSCOPY;  Surgeon: Rosetta Posner, DPM;  Location: [facility];  Service: Podiatry;  Laterality: Right;   ANKLE RECONSTRUCTION Right 11/02/2021   Procedure: Nicoletta Dress - GOULD;  Surgeon: Rosetta Posner, DPM;  Location: [facility];  Service: Podiatry;  Laterality: Right;  Anesthesia: Choice - preop pop block   CHOLECYSTECTOMY  2010   FLEXOR TENOTOMY  Right 11/02/2021   Procedure: FLEXOR TENDON REPAIR;  Surgeon: Rosetta Posner, DPM;  Location: [facility];  Service: Podiatry;  Laterality: Right;   Social History   Socioeconomic History   Marital status: Married    Spouse name: Not on file   Number of children: Not on file   Years of education: Not on file   Highest education  level: Not on file  Occupational History   Not on file  Tobacco Use   Smoking status: Never   Smokeless tobacco: Never  Vaping Use   Vaping status: Never Used  Substance and Sexual Activity   Alcohol use: Yes    Comment: occasional   Drug use: No   Sexual activity: Not Currently    Birth control/protection: Post-menopausal  Other Topics Concern   Not on file  Social History Narrative   Not on file   Social Determinants of Health   Financial Resource Strain: Not on file  Food Insecurity: Not on file  Transportation Needs: Not on file  Physical Activity: Unknown (07/15/2017)   Exercise Vital Sign    Days of Exercise per Week: 0 days    Minutes of Exercise per Session: Not on file  Stress: Not on file  Social Connections: Not on file   Family History  Problem Relation Age of Onset   Autoimmune disease Mother    Depression Father    Diabetes Father    CVA Father    Hyperlipidemia Father    Thyroid disease Sister    Autoimmune disease Sister    Uterine cancer Sister    Diabetes Brother    Hyperlipidemia Brother    Hypertension Brother  Thyroid disease Maternal Aunt    Bone cancer Maternal Grandfather    Breast cancer Paternal Grandmother 53   Brain cancer Paternal Grandfather 39   Allergies  Allergen Reactions   Sulfa Antibiotics Hives   Current Outpatient Medications  Medication Sig Dispense Refill   methocarbamol (ROBAXIN) 500 MG tablet Take 1 tablet (500 mg total) by mouth every 8 (eight) hours as needed for up to 14 days for muscle spasms. 42 tablet 0   Ascorbic Acid (VITAMIN C PO) Take by mouth.     azelastine (ASTELIN) 0.1 % nasal spray      Budesonide (PULMICORT FLEXHALER) 90 MCG/ACT inhaler Inhale 1 puff into the lungs 2 (two) times daily. 1 each 6   omeprazole (PRILOSEC) 20 MG capsule Take 1 capsule (20 mg total) by mouth daily before breakfast. 90 capsule 3   rosuvastatin (CRESTOR) 20 MG tablet Take 1 tablet (20 mg total) by mouth daily. 90 tablet 3    venlafaxine XR (EFFEXOR-XR) 75 MG 24 hr capsule TAKE 1 CAPSULE BY MOUTH  DAILY WITH BREAKFAST 90 capsule 3   No current facility-administered medications for this visit.   DG Lumbar Spine Complete  Result Date: 09/27/2022 CLINICAL DATA:  Low back pain radiating to bilateral legs. EXAM: LUMBAR SPINE - COMPLETE 4+ VIEW COMPARISON:  None Available. FINDINGS: There is no evidence of lumbar spine fracture. Scoliosis. Minimal decreased intervertebral spaces in the mid to lower lumbar spine. Grade 1 anterolisthesis of L4 on L5. The degree of anterolisthesis at L4 and L5 is 4 mm on extension view and 0 mm on flexion and neutral view. Mild facet joint sclerosis noted in the mid to lower lumbar spine. Prior cholecystectomy clips are noted. IMPRESSION: Degenerative joint changes of the lower lumbar spine as described. Electronically Signed   By: Sherian Rein M.D.   On: 09/27/2022 08:12    Review of Systems:   A ROS was performed including pertinent positives and negatives as documented in the HPI.  Physical Exam :   Constitutional: NAD and appears stated age Neurological: Alert and oriented Psych: Appropriate affect and cooperative There were no vitals taken for this visit.   Comprehensive Musculoskeletal Exam:    No significant tenderness palpation over the lumbar spine.  Negative straight leg raise bilaterally.  Passive hip range of motion bilaterally to 120 degrees flexion, 20 degrees external rotation, and 30 degrees internal rotation.  Tenderness to palpation over left greater trochanter.  Decreased strength of the left hip with resisted abduction.  Knee flexion and extension strength 5/5.  Distal neurosensory exam intact.  Imaging:   Xray (lumbar spine 4 views): Mild scoliosis.  Slight anterolisthesis of L4.  No acute abnormalities.  Xray (AP pelvis, left hip 3 views, right hip 3 views): Mild hip osteoarthritis bilaterally.  Otherwise negative.   I personally reviewed and interpreted the  radiographs.   Assessment:   62 y.o. female with bilateral hip pain, worse on the left side.  Some of this may be referred from the low back, but with her inability to lay or put pressure on the left lateral hip, I believe there is a likely component of trochanteric bursitis.  After discussion of treatment options, patient would like to proceed with cortisone injection of this area.  This was performed in clinic today under ultrasound guidance without any complication.  I do believe she also has component of gluteus medius weakness given her difficulty with abduction both in reported history and exam today.  I would like  to refer her to physical therapy for both her left hip and low back.  Will send in Robaxin to help with muscle spasms.  Plan to follow-up in 6 weeks to assess relief with injection and PT.  Plan :    -Left greater trochanter cortisone injection performed today under ultrasound guidance -Start Robaxin 500 mg as needed -Referral to physical therapy -Return to clinic in 6 weeks for follow-up evaluation     Procedure Note  Patient: JERIAH CASASSA             Date of Birth: 02-19-1960           MRN: [id number]             Visit Date: 09/26/2022  Procedures: Visit Diagnoses:  1. Pain of left hip   2. Pain of right hip   3. Chronic bilateral low back pain with bilateral sciatica     Large Joint Inj: L greater trochanter on 09/27/2022 6:57 PM Indications: pain Details: 18 G 3.5 in and 1.5 in needle, ultrasound-guided lateral approach Medications: 4 mL lidocaine 1 %; 2 mL triamcinolone acetonide 40 MG/ML Procedure, treatment alternatives, risks and benefits explained, specific risks discussed. Consent was given by the patient. Immediately prior to procedure a time out was called to verify the correct patient, procedure, equipment, support staff and site/side marked as required. Patient was prepped and draped in the usual sterile fashion.      I personally saw and  evaluated the patient, and participated in the management and treatment plan.  Hazle Nordmann, PA-C Orthopedics  This document was dictated using Conservation officer, historic buildings. A reasonable attempt at proof reading has been made to minimize errors.

## 2022-10-03 ENCOUNTER — Ambulatory Visit: Payer: BC Managed Care – PPO | Admitting: Obstetrics

## 2022-10-04 NOTE — Progress Notes (Signed)
Established patient visit   Patient: Christy Watson   DOB: 01/29/1961   62 y.o. Female  MRN: [id number] Visit Date: 10/11/2022  Today's healthcare provider: Ronnald Ramp, MD   Chief Complaint  Patient presents with   Hip Pain    Patient reports pain becoming constant in the last few months located in both hips but mainly left. She reports it is a little better with exercise but still there. Not taking any medications for pain.   Subjective     HPI     Hip Pain    Additional comments: Patient reports pain becoming constant in the last few months located in both hips but mainly left. She reports it is a little better with exercise but still there. Not taking any medications for pain.      Last edited by Acey Lav, CMA on 10/11/2022  8:22 AM.       Discussed the use of AI scribe software for clinical note transcription with the patient, who gave verbal consent to proceed.  History of Present Illness   The patient, with a history of joint pain, presents for a follow-up visit after receiving a corticosteroid injection and starting Robaxin. She reports an improvement in pain levels since the injection, attributing additional relief to regular gym visits. The patient describes the pain as less intense, particularly in the lower back, and notes improved sleep quality. She speculates that the reduction in sedentary behavior due to her gym routine may be contributing to her improvement.  The patient also reports experiencing muscle spasms in the back, which have decreased in frequency and severity. She is unsure if this improvement is due to the Robaxin or her gym routine. Over-the-counter pain medications, such as Tylenol and NSAIDs, reportedly provide little relief for the patient's pain.  In addition to joint pain, the patient experiences occasional tingling in her hands and feet, which sometimes awakens her at night. The sensation is described as affecting the  entire hand, not isolated to specific fingers. She manages this by adjusting her hand position, which usually alleviates the sensation.  The patient also reports a chronic dry cough, which is particularly problematic a few times a year, typically during allergy seasons. She has previously been prescribed Flovent for this issue, which she uses sporadically. However, she reports difficulties in obtaining this medication due to insurance issues.  Lastly, the patient has been actively working on [employer], incorporating regular gym visits and dietary modifications into her routine. She reports a significant weight loss of 13 pounds within the past month. Despite occasional indulgences, the patient is committed to maintaining a healthier lifestyle and continuing her weight loss journey.      Medical updates:   Hip Pain  Reviewed patient's EMR for specialist visits since our last office visit Patient was evaluated by orthopedics for her bilateral hip pain She received as greater trochanters steroid injection under ultrasound guidance and started on Robaxin with plan for 6-week follow-up   Medications: Outpatient Medications Prior to Visit  Medication Sig   omeprazole (PRILOSEC) 20 MG capsule Take 1 capsule (20 mg total) by mouth daily before breakfast.   rosuvastatin (CRESTOR) 20 MG tablet Take 1 tablet (20 mg total) by mouth daily.   venlafaxine XR (EFFEXOR-XR) 75 MG 24 hr capsule TAKE 1 CAPSULE BY MOUTH  DAILY WITH BREAKFAST   Ascorbic Acid (VITAMIN C PO) Take by mouth. (Patient not taking: Reported on 10/11/2022)   azelastine (ASTELIN) 0.1 % nasal  spray  (Patient not taking: Reported on 10/11/2022)   [DISCONTINUED] Budesonide (PULMICORT FLEXHALER) 90 MCG/ACT inhaler Inhale 1 puff into the lungs 2 (two) times daily. (Patient not taking: Reported on 10/11/2022)   No facility-administered medications prior to visit.   Past Medical History:  Diagnosis Date   Allergy    Depression    GERD  (gastroesophageal reflux disease)    Hypercholesterolemia    Vertigo 2014     Review of Systems      Objective    BP 134/72 (BP Location: Left Arm, Patient Position: Sitting, Cuff Size: Normal)   Pulse 75   Resp 10   Ht 5\' 6"  (1.676 m)   Wt 211 lb (95.7 kg)   SpO2 99%   BMI 34.06 kg/m   BP Readings from Last 3 Encounters:  10/11/22 134/72  09/13/22 136/81  11/02/21 127/81   Wt Readings from Last 3 Encounters:  10/11/22 211 lb (95.7 kg)  09/13/22 224 lb (101.6 kg)  11/02/21 207 lb (93.9 kg)       Physical Exam Vitals reviewed.  Constitutional:      General: She is not in acute distress.    Appearance: Normal appearance. She is not ill-appearing, toxic-appearing or diaphoretic.  Eyes:     Conjunctiva/sclera: Conjunctivae normal.  Cardiovascular:     Rate and Rhythm: Normal rate and regular rhythm.     Pulses: Normal pulses.     Heart sounds: Normal heart sounds. No murmur heard.    No friction rub. No gallop.  Pulmonary:     Effort: Pulmonary effort is normal. No respiratory distress.     Breath sounds: Normal breath sounds. No stridor. No wheezing, rhonchi or rales.  Abdominal:     General: Bowel sounds are normal. There is no distension.     Palpations: Abdomen is soft.     Tenderness: There is no abdominal tenderness.  Musculoskeletal:     Right hand: No swelling, deformity or tenderness. Normal range of motion. Normal strength. Normal pulse.     Left hand: No swelling, deformity or tenderness. Normal range of motion. Normal strength. Normal pulse.     Right lower leg: No edema.     Left lower leg: No edema.     Comments: She has positive Tinel's on the right.  And positive phalen  For hip exam she has tenderness over bilateral greater trochanters, greater on the left than the right, she has normal range of motion with no pain in all ranges bilateral hips she has full range of motion at the knees bilaterally she has tenderness to palpation manual did not  notice any swelling or deformity on exam today  Skin:    Findings: No erythema or rash.  Neurological:     Mental Status: She is alert and oriented to person, place, and time.       No results found for any visits on 10/11/22.  Assessment & Plan     Problem List Items Addressed This Visit     Bilateral hip pain    Improvement noted after corticosteroid injection and initiation of Robaxin as needed. Patient also reports improvement with regular gym exercise. -Chronic -Continue current management with Robaxin as needed and regular exercise. -Continue over-the-counter medications as needed Follow-up with orthopedics as scheduled      Carpal tunnel syndrome on right - Primary    Reports of intermittent numbness and tingling in hands, more pronounced in the right hand. Possible carpal tunnel syndrome. -Chronic problem, intermittent,  mild symptoms -Trial of carpal tunnel brace on right hand at night.       Chronic bilateral low back pain without sciatica     -Chronic, improving with work hours -continue NSAIDS, rest and other conservative measures -Continue to follow with orthopedics as scheduled      Chronic cough    Dry cough occurring a few times a year, typically during allergy seasons. Previous use of Flovent was beneficial but currently not covered by insurance.  Patient has not tried Pulmicort daily use previously prescribed -Send prescription for Qvar 40 mcg, twice daily to [facility] for insurance approval. -Consider pulmonary referral for spirometry if symptoms become more frequent      Obesity (BMI 30-39.9)    Patient has lost 13 pounds in the past month through dietary changes and regular exercise. -Chronic, improved BMI from 36-31 -Encourage continuation of current lifestyle changes. -Schedule physical exam in early December to monitor progress.         Return in about 3 months (around 01/13/2023) for CPE.      Ronnald Ramp, MD  [facility] [phone] (phone) [phone] (fax)  [facility]

## 2022-10-09 ENCOUNTER — Ambulatory Visit
Admission: RE | Admit: 2022-10-09 | Discharge: 2022-10-09 | Disposition: A | Discharge status: Home / Self Care | Payer: BC Managed Care – PPO | Source: Ambulatory Visit | Attending: Family Medicine | Admitting: Family Medicine

## 2022-10-09 DIAGNOSIS — Z1231 Encounter for screening mammogram for malignant neoplasm of breast: Secondary | ICD-10-CM | POA: Insufficient documentation

## 2022-10-11 ENCOUNTER — Encounter: Payer: Self-pay | Admitting: Family Medicine

## 2022-10-11 ENCOUNTER — Ambulatory Visit (INDEPENDENT_AMBULATORY_CARE_PROVIDER_SITE_OTHER): Payer: BC Managed Care – PPO | Admitting: Family Medicine

## 2022-10-11 ENCOUNTER — Other Ambulatory Visit: Payer: Self-pay | Admitting: Family Medicine

## 2022-10-11 VITALS — BP 134/72 | HR 75 | Resp 10 | Ht 66.0 in | Wt 211.0 lb

## 2022-10-11 DIAGNOSIS — E669 Obesity, unspecified: Secondary | ICD-10-CM | POA: Diagnosis not present

## 2022-10-11 DIAGNOSIS — M545 Low back pain, unspecified: Secondary | ICD-10-CM | POA: Diagnosis not present

## 2022-10-11 DIAGNOSIS — R053 Chronic cough: Secondary | ICD-10-CM

## 2022-10-11 DIAGNOSIS — M25551 Pain in right hip: Secondary | ICD-10-CM | POA: Diagnosis not present

## 2022-10-11 DIAGNOSIS — E782 Mixed hyperlipidemia: Secondary | ICD-10-CM

## 2022-10-11 DIAGNOSIS — I10 Essential (primary) hypertension: Secondary | ICD-10-CM

## 2022-10-11 DIAGNOSIS — K219 Gastro-esophageal reflux disease without esophagitis: Secondary | ICD-10-CM

## 2022-10-11 DIAGNOSIS — M25552 Pain in left hip: Secondary | ICD-10-CM

## 2022-10-11 DIAGNOSIS — G5601 Carpal tunnel syndrome, right upper limb: Secondary | ICD-10-CM | POA: Diagnosis not present

## 2022-10-11 DIAGNOSIS — G8929 Other chronic pain: Secondary | ICD-10-CM

## 2022-10-11 MED ORDER — BECLOMETHASONE DIPROP HFA 40 MCG/ACT IN AERB: 1.0000 | INHALATION_SPRAY | Freq: Two times a day (BID) | RESPIRATORY_TRACT | 3 refills | Status: DC

## 2022-10-11 NOTE — Assessment & Plan Note (Signed)
-  Chronic, improving with work hours -continue NSAIDS, rest and other conservative measures -Continue to follow with orthopedics as scheduled

## 2022-10-11 NOTE — Assessment & Plan Note (Signed)
Reports of intermittent numbness and tingling in hands, more pronounced in the right hand. Possible carpal tunnel syndrome. -Chronic problem, intermittent, mild symptoms -Trial of carpal tunnel brace on right hand at night.

## 2022-10-11 NOTE — Assessment & Plan Note (Signed)
Dry cough occurring a few times a year, typically during allergy seasons. Previous use of Flovent was beneficial but currently not covered by insurance.  Patient has not tried Pulmicort daily use previously prescribed -Send prescription for Qvar 40 mcg, twice daily to [facility] for insurance approval. -Consider pulmonary referral for spirometry if symptoms become more frequent

## 2022-10-11 NOTE — Assessment & Plan Note (Signed)
Patient has lost 13 pounds in the past month through dietary changes and regular exercise. -Chronic, improved BMI from 36-31 -Encourage continuation of current lifestyle changes. -Schedule physical exam in early December to monitor progress.

## 2022-10-11 NOTE — Assessment & Plan Note (Addendum)
Improvement noted after corticosteroid injection and initiation of Robaxin as needed. Patient also reports improvement with regular gym exercise. -Chronic -Continue current management with Robaxin as needed and regular exercise. -Continue over-the-counter medications as needed Follow-up with orthopedics as scheduled

## 2022-10-25 ENCOUNTER — Ambulatory Visit (INDEPENDENT_AMBULATORY_CARE_PROVIDER_SITE_OTHER): Payer: BC Managed Care – PPO | Admitting: Family Medicine

## 2022-10-25 ENCOUNTER — Encounter: Payer: Self-pay | Admitting: Family Medicine

## 2022-10-25 VITALS — BP 107/54 | HR 80 | Ht 66.0 in | Wt 209.4 lb

## 2022-10-25 DIAGNOSIS — K219 Gastro-esophageal reflux disease without esophagitis: Secondary | ICD-10-CM

## 2022-10-25 DIAGNOSIS — J029 Acute pharyngitis, unspecified: Secondary | ICD-10-CM

## 2022-10-25 DIAGNOSIS — R490 Dysphonia: Secondary | ICD-10-CM | POA: Insufficient documentation

## 2022-10-25 LAB — POCT RAPID STREP A (OFFICE): Rapid Strep A Screen: NEGATIVE

## 2022-10-25 NOTE — Assessment & Plan Note (Signed)
Gastroesophageal Reflux Disease (GERD) Controlled on Omeprazole. No current symptoms unless medication is missed or diet triggers symptoms. -Continue Omeprazole 20mg  as prescribed. -Continue dietary modifications to avoid trigger foods.

## 2022-10-25 NOTE — Progress Notes (Signed)
Established patient visit   Patient: Christy Watson   DOB: 1960/06/13   62 y.o. Female  MRN: [id number] Visit Date: 10/25/2022  Today's healthcare provider: Ronnald Ramp, MD   Chief Complaint  Patient presents with   Medical Management of Chronic Issues    Has a sore throat and hoarseness, has been a problem for a few months now, minimal coughing, feels as if sinuses are draining constantly, no prior treatment    Subjective     HPI     Medical Management of Chronic Issues    Additional comments: Has a sore throat and hoarseness, has been a problem for a few months now, minimal coughing, feels as if sinuses are draining constantly, no prior treatment       Last edited by Rolly Salter, CMA on 10/25/2022  8:25 AM.       Discussed the use of AI scribe software for clinical note transcription with the patient, who gave verbal consent to proceed.  History of Present Illness   The patient presents with a four-month history of a progressively hoarse, gravelly voice and chronic sore throat. She describes a sensation of a lump or obstruction in the throat, which has been causing significant discomfort. The patient denies any precipitating illness or event that may have initiated these symptoms. She has not attempted any treatments or interventions to alleviate the symptoms, choosing instead to endure the discomfort.  The patient has no history of smoking, vaping, or use of chewing tobacco. She has been using Astelin nasal spray sporadically for allergies, but has not noticed any significant difference in her symptoms with its use. She also reports occasional headaches, which she attributes to sinus issues.  The patient has been taking omeprazole for reflux, which seems to control her symptoms unless she misses a dose or consumes certain foods, such as citrus fruits or tomato sauce. Despite these ongoing issues, the patient has not experienced a similar episode of  throat discomfort or voice changes in the past.        Past Medical History:  Diagnosis Date   Allergy    Depression    GERD (gastroesophageal reflux disease)    Hypercholesterolemia    Vertigo 2014    Medications: Outpatient Medications Prior to Visit  Medication Sig   azelastine (ASTELIN) 0.1 % nasal spray    omeprazole (PRILOSEC) 20 MG capsule Take 1 capsule (20 mg total) by mouth daily before breakfast.   rosuvastatin (CRESTOR) 20 MG tablet Take 1 tablet (20 mg total) by mouth daily.   venlafaxine XR (EFFEXOR-XR) 75 MG 24 hr capsule TAKE 1 CAPSULE BY MOUTH  DAILY WITH BREAKFAST   PULMICORT FLEXHALER 90 MCG/ACT inhaler Inhale 1 puff into the lungs 2 (two) times daily. (Patient not taking: Reported on 10/25/2022)   [DISCONTINUED] Ascorbic Acid (VITAMIN C PO) Take by mouth. (Patient not taking: Reported on 10/11/2022)   No facility-administered medications prior to visit.    Review of Systems      Objective    BP (!) 107/54 (BP Location: Left Arm, Patient Position: Sitting, Cuff Size: Large)   Pulse 80   Ht 5\' 6"  (1.676 m)   Wt 209 lb 6.4 oz (95 kg)   SpO2 98%   BMI 33.80 kg/m     Physical Exam  Physical Exam   HEENT: Right anterior cervical lymphadenopathy with tenderness. Oropharynx erythematous, cobblestone appearance indicating postnasal drainage. Tympanic membranes normal, no bulging, erythema, or effusions. Nasal exam reveals  no rhinorrhea or enlarged turbinates, and no obstruction in nasal cavities.       Results for orders placed or performed in visit on 10/25/22  POCT rapid strep A  Result Value Ref Range   Rapid Strep A Screen Negative Negative    Assessment & Plan     Problem List Items Addressed This Visit     Gastroesophageal reflux disease    Gastroesophageal Reflux Disease (GERD) Controlled on Omeprazole. No current symptoms unless medication is missed or diet triggers symptoms. -Continue Omeprazole 20mg  as prescribed. -Continue dietary  modifications to avoid trigger foods.           Hoarse - Primary    Hoarseness and sore throat for approximately four months. No history of smoking or exposure to noxious chemicals. Oropharynx is erythematous with evidence of postnasal drainage. No acute findings on examination. -Refer to Ear, Nose, and Throat (ENT) for further evaluation and possible laryngoscopy. -Continue Astelin nasal spray as needed for postnasal drip.      Relevant Orders   Ambulatory referral to ENT   Other Visit Diagnoses     Sore throat       Relevant Orders   POCT rapid strep A (Completed)              No follow-ups on file.         Ronnald Ramp, MD  [facility] [phone] (phone) [phone] (fax)  [facility]

## 2022-10-25 NOTE — Assessment & Plan Note (Signed)
Hoarseness and sore throat for approximately four months. No history of smoking or exposure to noxious chemicals. Oropharynx is erythematous with evidence of postnasal drainage. No acute findings on examination. -Refer to Ear, Nose, and Throat (ENT) for further evaluation and possible laryngoscopy. -Continue Astelin nasal spray as needed for postnasal drip.

## 2022-10-30 ENCOUNTER — Ambulatory Visit: Payer: BC Managed Care – PPO | Admitting: Obstetrics

## 2022-11-02 ENCOUNTER — Ambulatory Visit: Payer: Self-pay | Admitting: Family Medicine

## 2022-11-08 ENCOUNTER — Ambulatory Visit: Payer: BC Managed Care – PPO

## 2022-11-08 DIAGNOSIS — G478 Other sleep disorders: Secondary | ICD-10-CM | POA: Diagnosis not present

## 2022-11-08 DIAGNOSIS — K219 Gastro-esophageal reflux disease without esophagitis: Secondary | ICD-10-CM | POA: Diagnosis not present

## 2022-11-08 DIAGNOSIS — M26609 Unspecified temporomandibular joint disorder, unspecified side: Secondary | ICD-10-CM | POA: Diagnosis not present

## 2022-11-08 DIAGNOSIS — R490 Dysphonia: Secondary | ICD-10-CM | POA: Diagnosis not present

## 2022-11-08 DIAGNOSIS — J301 Allergic rhinitis due to pollen: Secondary | ICD-10-CM | POA: Diagnosis not present

## 2022-11-14 DIAGNOSIS — M216X1 Other acquired deformities of right foot: Secondary | ICD-10-CM | POA: Diagnosis not present

## 2022-11-14 DIAGNOSIS — M722 Plantar fascial fibromatosis: Secondary | ICD-10-CM | POA: Diagnosis not present

## 2022-11-14 DIAGNOSIS — M79671 Pain in right foot: Secondary | ICD-10-CM | POA: Diagnosis not present

## 2022-11-14 DIAGNOSIS — M2141 Flat foot [pes planus] (acquired), right foot: Secondary | ICD-10-CM | POA: Diagnosis not present

## 2022-11-18 DIAGNOSIS — G4733 Obstructive sleep apnea (adult) (pediatric): Secondary | ICD-10-CM | POA: Diagnosis not present

## 2022-12-20 DIAGNOSIS — R490 Dysphonia: Secondary | ICD-10-CM | POA: Diagnosis not present

## 2022-12-20 DIAGNOSIS — M26609 Unspecified temporomandibular joint disorder, unspecified side: Secondary | ICD-10-CM | POA: Diagnosis not present

## 2022-12-20 DIAGNOSIS — G4733 Obstructive sleep apnea (adult) (pediatric): Secondary | ICD-10-CM | POA: Diagnosis not present

## 2022-12-20 DIAGNOSIS — J301 Allergic rhinitis due to pollen: Secondary | ICD-10-CM | POA: Diagnosis not present

## 2023-01-24 ENCOUNTER — Encounter: Payer: BC Managed Care – PPO | Admitting: Family Medicine

## 2023-01-30 DIAGNOSIS — M722 Plantar fascial fibromatosis: Secondary | ICD-10-CM | POA: Diagnosis not present

## 2023-01-30 DIAGNOSIS — M2141 Flat foot [pes planus] (acquired), right foot: Secondary | ICD-10-CM | POA: Diagnosis not present

## 2023-01-30 DIAGNOSIS — M216X1 Other acquired deformities of right foot: Secondary | ICD-10-CM | POA: Diagnosis not present

## 2023-01-30 DIAGNOSIS — M2142 Flat foot [pes planus] (acquired), left foot: Secondary | ICD-10-CM | POA: Diagnosis not present

## 2023-02-14 ENCOUNTER — Ambulatory Visit: Payer: Self-pay

## 2023-02-14 NOTE — Telephone Encounter (Signed)
 Chief Complaint: Dizziness Symptoms: Dizziness like the room is spinning, lightheaded Frequency: comes and goes  Pertinent Negatives: Patient denies chest pain, nausea, vomiting, headache, ear pain  Disposition: [] ED /[] Urgent Care (no appt availability in office) / [x] Appointment(In office/virtual)/ []  [facility]/ [] Home Care/ [] Refused Recommended Disposition /[] [facility]/ []  Follow-up with PCP Additional Notes: Patient stated on 01/24/23 she experienced dizziness like the room was spinning. Patient stated she was seen by her insurance telehealth service was told if it happens again follow-up with PCP. Patient stated she felt dizzy again on 02/06/23 and she notice it felt worse when lifting her head up or down but it is fine moving the head side to side. Patient also stated she felt lightheaded sometimes. Patient asked if she needed to be seen by ENT. Care advice was given and patient requested an early morning appointment. Patient scheduled 03/06/23 to fulfill her time request. Advised patient if symptoms get worse to callback. Patient verbalized understanding. Summary: dizzziness   Pt called in has dizziness     Reason for Disposition  [1] MODERATE dizziness (e.g., vertigo; feels very unsteady, interferes with normal activities) AND [2] has NOT been evaluated by doctor (or NP/PA) for this  Answer Assessment - Initial Assessment Questions 1. DESCRIPTION: Describe your dizziness.     Very dizzy 2. VERTIGO: Do you feel like either you or the room is spinning or tilting?      Yes 3. LIGHTHEADED: Do you feel lightheaded? (e.g., somewhat faint, woozy, weak upon standing)     Yes 4. SEVERITY: How bad is it?  Can you walk?   - MILD: Feels slightly dizzy and unsteady, but is walking normally.   - MODERATE: Feels unsteady when walking, but not falling; interferes with normal activities (e.g., school, work).   - SEVERE: Unable to walk without falling, or requires  assistance to walk without falling.     Moderate 5. ONSET:  When did the dizziness begin?     01/24/23 6. AGGRAVATING FACTORS: Does anything make it worse? (e.g., standing, change in head position)     Look down or looking up  7. CAUSE: What do you think is causing the dizziness?     Vertigo  8. RECURRENT SYMPTOM: Have you had dizziness before? If Yes, ask: When was the last time? What happened that time?     Yes 01/24/23 and 02/06/23 9. OTHER SYMPTOMS: Do you have any other symptoms? (e.g., headache, weakness, numbness, vomiting, earache)     No  Protocols used: Dizziness - Vertigo-A-AH

## 2023-02-18 ENCOUNTER — Encounter: Payer: Self-pay | Admitting: Family Medicine

## 2023-02-18 ENCOUNTER — Ambulatory Visit (INDEPENDENT_AMBULATORY_CARE_PROVIDER_SITE_OTHER): Payer: BC Managed Care – PPO | Admitting: Family Medicine

## 2023-02-18 VITALS — BP 124/78 | HR 92 | Resp 18 | Ht 66.0 in | Wt 212.2 lb

## 2023-02-18 DIAGNOSIS — R42 Dizziness and giddiness: Secondary | ICD-10-CM | POA: Diagnosis not present

## 2023-02-18 DIAGNOSIS — I10 Essential (primary) hypertension: Secondary | ICD-10-CM

## 2023-02-18 MED ORDER — DIAZEPAM 2 MG PO TABS: 1.0000 mg | ORAL_TABLET | Freq: Two times a day (BID) | ORAL | 0 refills | Status: AC | PRN

## 2023-02-18 MED ORDER — ONDANSETRON 4 MG PO TBDP: 4.0000 mg | ORAL_TABLET | Freq: Three times a day (TID) | ORAL | 0 refills | Status: AC | PRN

## 2023-02-18 NOTE — Assessment & Plan Note (Signed)
 Intermittent vertigo since mid-December, exacerbated by head movements such as lying down, standing up, and looking down. Symptoms include dizziness, nausea, and a sensation of being drunk. No associated hearing loss or tinnitus. Epley maneuver poorly tolerated due to nausea. Meclizine causes significant drowsiness, impacting ability to work. Physical exam and positional testing induced nausea and dizziness. Differential includes benign paroxysmal positional vertigo (BPPV) and other vestibular disorders. Discussed short course of Valium  to interrupt the cycle of vertigo, with potential drowsiness as a side effect. Referral to vestibular therapy for retraining visual reflexes. Consideration for imaging if symptoms persist. Informed consent obtained for Valium  and vestibular therapy, including discussion of risks, benefits, and alternatives. - Prescribe Valium  1 mg every 12 hours as needed for severe vertigo symptoms - Refer to vestibular therapy - Recommend follow-up with ENT specialist - Schedule follow-up appointment in 2-3 weeks

## 2023-02-18 NOTE — Progress Notes (Signed)
 Established patient visit   Patient: Christy Watson   DOB: 05/04/1960   63 y.o. Female  MRN: [id number] Visit Date: 02/18/2023  Today's healthcare provider: Rockie Agent, MD   Chief Complaint  Patient presents with   Dizziness    Vertigo for 4 days in mid December, returned on Christmas day and problematic since   Subjective     HPI     Dizziness    Additional comments: Vertigo for 4 days in mid December, returned on Christmas day and problematic since      Last edited by Anette Alan LITTIE, CMA on 02/18/2023  2:51 PM.       Discussed the use of AI scribe software for clinical note transcription with the patient, who gave verbal consent to proceed.  History of Present Illness   The patient is a 63 year old individual with a history of vertigo that began four days in mid-December, improved, but then returned at the end of December and has persisted since. The patient describes the vertigo as feeling drunk, with difficulty walking straight, dizziness, and nausea. These symptoms can be triggered by various movements such as lying down, standing up, looking at the floor, closing her eyes, or looking back. The symptoms are mitigated when the head is kept straight. Bending over to pick something up from the floor exacerbates the symptoms.  The patient has tried meclizine for the vertigo, but it causes fatigue, which interferes with her work. The patient has also attempted the Epley maneuver, but it induces nausea and is described as sickening. The patient denies any associated pain, hearing loss, or tinnitus. The onset of the vertigo was sudden, occurring when the patient moved in bed. The patient has also experienced a sore throat for the past four months, which has been evaluated by an ENT specialist.  The patient's current medications include Crestor  20 mg daily, Homoport 1 puff twice daily, venlafaxine  75 mg daily, and Flonase  50 mcg. The patient's blood pressure  is well-controlled. The patient's vertigo is intermittent and not constant. The patient denies any chest pain or feeling of passing out during the vertigo episodes. The patient has been sleeping upright.         Past Medical History:  Diagnosis Date   Allergy    Depression    GERD (gastroesophageal reflux disease)    Hypercholesterolemia    Vertigo 2014    Medications: Outpatient Medications Prior to Visit  Medication Sig   fluticasone  (FLONASE ) 50 MCG/ACT nasal spray Place 1 spray into both nostrils daily.   omeprazole  (PRILOSEC) 20 MG capsule Take 1 capsule (20 mg total) by mouth daily before breakfast.   PULMICORT  FLEXHALER 90 MCG/ACT inhaler Inhale 1 puff into the lungs 2 (two) times daily. (Patient taking differently: Inhale 1 puff into the lungs 2 (two) times daily. PRN)   rosuvastatin  (CRESTOR ) 20 MG tablet Take 1 tablet (20 mg total) by mouth daily.   venlafaxine  XR (EFFEXOR -XR) 75 MG 24 hr capsule TAKE 1 CAPSULE BY MOUTH  DAILY WITH BREAKFAST   [DISCONTINUED] azelastine  (ASTELIN ) 0.1 % nasal spray    No facility-administered medications prior to visit.    Review of Systems  Last CBC Lab Results  Component Value Date   WBC 7.9 09/13/2022   HGB 14.7 09/13/2022   HCT 42.7 09/13/2022   MCV 87 09/13/2022   MCH 30.1 09/13/2022   RDW 13.2 09/13/2022   PLT 258 09/13/2022   Last metabolic panel Lab Results  Component  Value Date   GLUCOSE 112 (H) 09/13/2022   NA 140 09/13/2022   K 4.5 09/13/2022   CL 101 09/13/2022   CO2 25 09/13/2022   BUN 13 09/13/2022   CREATININE 0.68 09/13/2022   EGFR 100 09/13/2022   CALCIUM  9.4 09/13/2022   PROT 7.1 09/13/2022   ALBUMIN 4.6 09/13/2022   LABGLOB 2.5 09/13/2022   BILITOT 0.4 09/13/2022   ALKPHOS 123 (H) 09/13/2022   AST 42 (H) 09/13/2022   ALT 63 (H) 09/13/2022   Last lipids Lab Results  Component Value Date   CHOL 188 09/13/2022   HDL 54 09/13/2022   LDLCALC 96 09/13/2022   TRIG 222 (H) 09/13/2022   CHOLHDL 3.5  09/13/2022   Last hemoglobin A1c Lab Results  Component Value Date   HGBA1C 5.6 09/13/2022        Objective    BP 124/78   Pulse 92   Resp 18   Ht 5' 6 (1.676 m)   Wt 212 lb 3.2 oz (96.3 kg)   SpO2 97%   BMI 34.25 kg/m  BP Readings from Last 3 Encounters:  02/18/23 124/78  10/25/22 (!) 107/54  10/11/22 134/72   Wt Readings from Last 3 Encounters:  02/18/23 212 lb 3.2 oz (96.3 kg)  10/25/22 209 lb 6.4 oz (95 kg)  10/11/22 211 lb (95.7 kg)        Physical Exam  Neuro: normal grip strength, unable to tolerate dix hallpike maneuver due to severe dizziness and nausea  Normal pupillary reactions, PERRLA, CN 2-12 grossly intact bilaterally  No results found for any visits on 02/18/23.  Assessment & Plan     Problem List Items Addressed This Visit       Cardiovascular and Mediastinum   Benign essential hypertension     Other   Vertigo - Primary   Intermittent vertigo since mid-December, exacerbated by head movements such as lying down, standing up, and looking down. Symptoms include dizziness, nausea, and a sensation of being drunk. No associated hearing loss or tinnitus. Epley maneuver poorly tolerated due to nausea. Meclizine causes significant drowsiness, impacting ability to work. Physical exam and positional testing induced nausea and dizziness. Differential includes benign paroxysmal positional vertigo (BPPV) and other vestibular disorders. Discussed short course of Valium  to interrupt the cycle of vertigo, with potential drowsiness as a side effect. Referral to vestibular therapy for retraining visual reflexes. Consideration for imaging if symptoms persist. Informed consent obtained for Valium  and vestibular therapy, including discussion of risks, benefits, and alternatives. - Prescribe Valium  1 mg every 12 hours as needed for severe vertigo symptoms - Refer to vestibular therapy - Recommend follow-up with ENT specialist - Schedule follow-up appointment in 2-3  weeks      Relevant Medications   diazepam  (VALIUM ) 2 MG tablet   ondansetron  (ZOFRAN -ODT) 4 MG disintegrating tablet   Other Relevant Orders   Ambulatory referral to Physical Therapy           Return in about 3 weeks (around 03/11/2023) for Dizziness.         Rockie Agent, MD  [facility] [phone] (phone) [phone] (fax)  [facility]

## 2023-02-18 NOTE — Patient Instructions (Addendum)
 VISIT SUMMARY:  Today, we discussed your ongoing vertigo symptoms that began in mid-December and have persisted. You described feeling dizzy, nauseous, and having difficulty walking straight, especially with certain head movements. We reviewed your current medications and discussed potential treatments for your vertigo.  YOUR PLAN:  -VERTIGO: Vertigo is a condition where you feel dizzy and off-balance, often triggered by head movements. We discussed that your symptoms might be due to benign paroxysmal positional vertigo (BPPV) or other vestibular disorders. We have prescribed Valium  1 mg every 12 hours as needed for severe symptoms, referred you to vestibular therapy to help retrain your visual reflexes, and recommended a follow-up with an ENT specialist. If symptoms persist, we may consider imaging tests.  I have also sent in a prescription for Zofran  to help with any residual nausea. Please use a disintegrating tablet every 8 hours as needed   INSTRUCTIONS:  Please follow up in 2-3 weeks for your vertigo. Additionally, schedule an appointment with an ENT specialist. If your vertigo symptoms persist, we may consider a CT imaging test.

## 2023-03-06 ENCOUNTER — Ambulatory Visit: Payer: BC Managed Care – PPO | Admitting: Family Medicine

## 2023-03-11 ENCOUNTER — Ambulatory Visit: Payer: BC Managed Care – PPO | Admitting: Family Medicine

## 2023-03-13 DIAGNOSIS — E782 Mixed hyperlipidemia: Secondary | ICD-10-CM | POA: Diagnosis not present

## 2023-03-13 DIAGNOSIS — F418 Other specified anxiety disorders: Secondary | ICD-10-CM | POA: Diagnosis not present

## 2023-03-13 DIAGNOSIS — K219 Gastro-esophageal reflux disease without esophagitis: Secondary | ICD-10-CM | POA: Diagnosis not present

## 2023-03-13 DIAGNOSIS — Z1331 Encounter for screening for depression: Secondary | ICD-10-CM | POA: Diagnosis not present

## 2023-07-05 IMAGING — MR MR ANKLE*R* W/O CM
4 of 5 series · 12 of 40 positions shown · non-contrast
Comparison: Radiographs dated July 12, 2021

CLINICAL DATA: Ankle pain, instability.  Negative x-ray.

EXAM:
MRI OF THE RIGHT ANKLE WITHOUT CONTRAST
TECHNIQUE: Multiplanar, multisequence MR imaging of the ankle was performed. No
intravenous contrast was administered.

[Series 3: PD fat-sat · axial · right · 3.0mm · 0.25mm/px · z∈[+9,+89]mm · 3 of 28 slices shown]
[im 4/28]
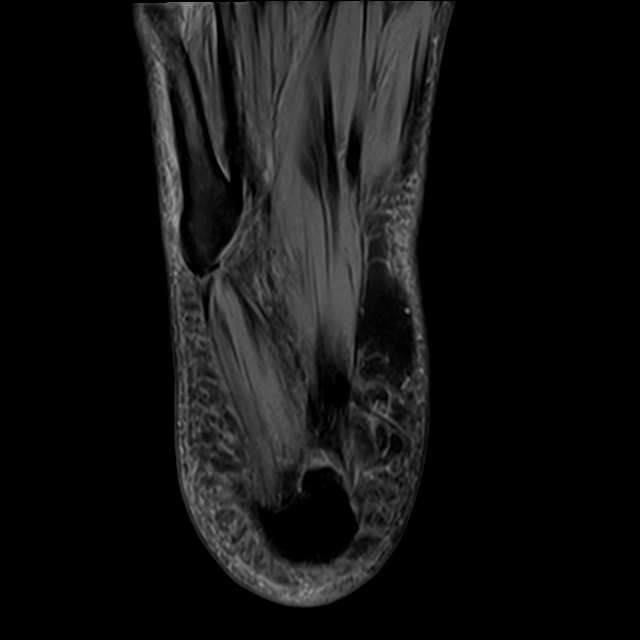
[im 16/28]
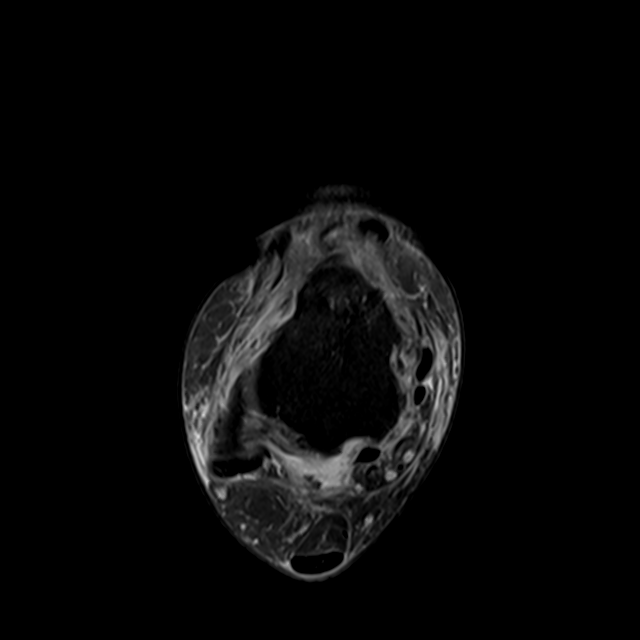
[im 24/28]
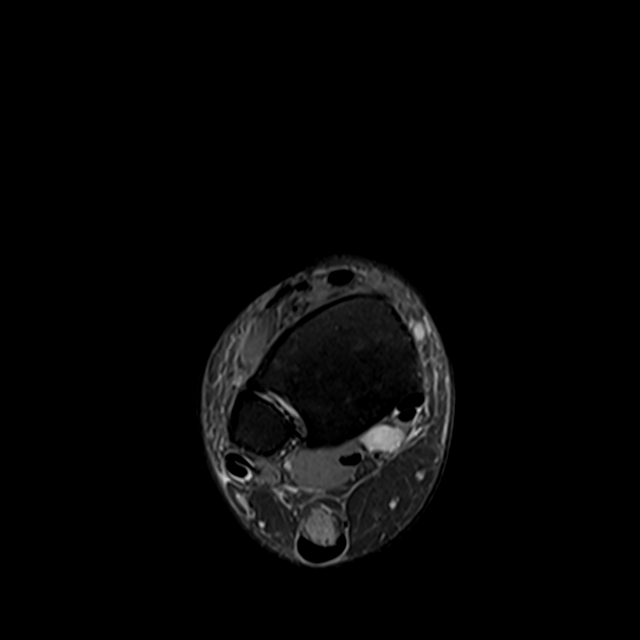

[Series 4: T2 fat-sat · axial · right · 3.0mm · 0.25mm/px · z∈[+9,+89]mm · 3 of 28 slices shown (1 of 2)]
[im 4/28]
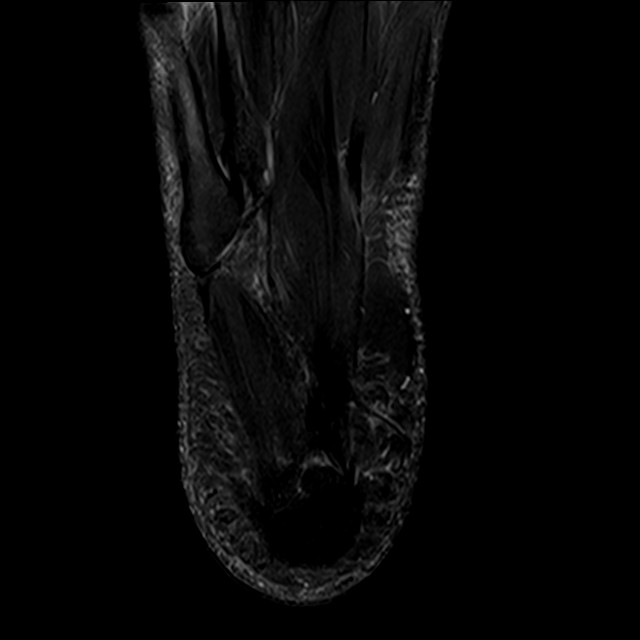
[im 14/28]
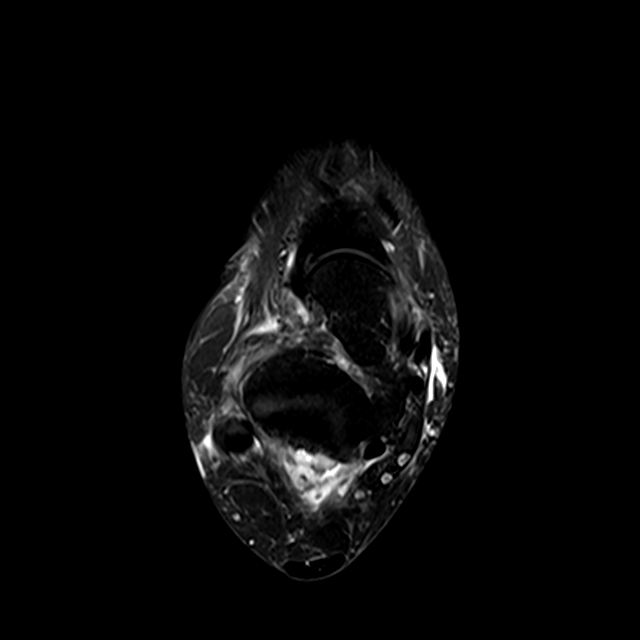
[im 24/28]
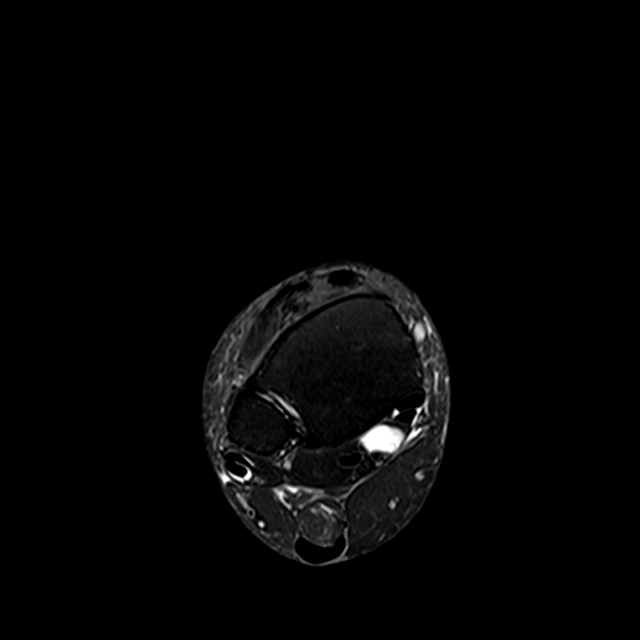

[Series 5: T1 · sagittal · right · 4.0mm · 0.27mm/px · 3 of 19 slices shown]
[im 4/19]
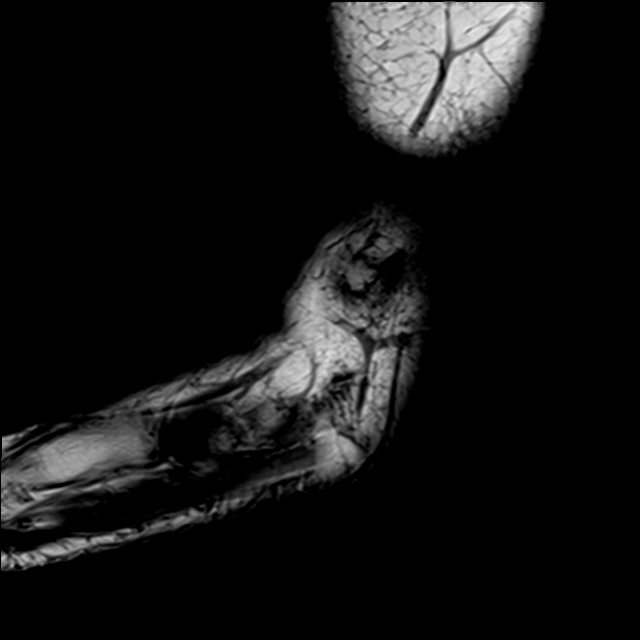
[im 11/19]
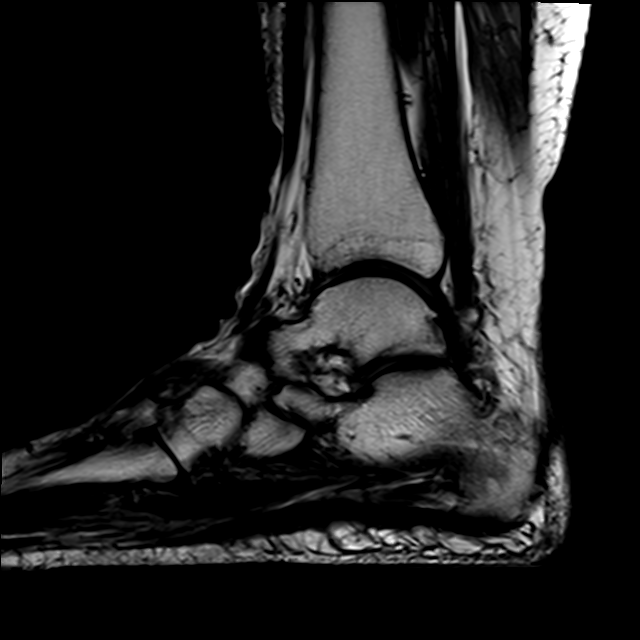
[im 19/19]
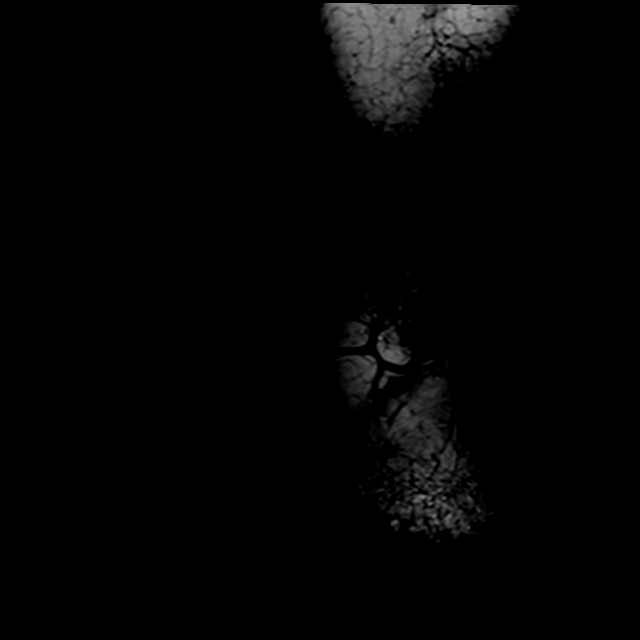

[Series 7: T2 fat-sat · coronal · right · 3.0mm · 0.25mm/px · 3 of 35 slices shown (2 of 2)]
[im 4/35]
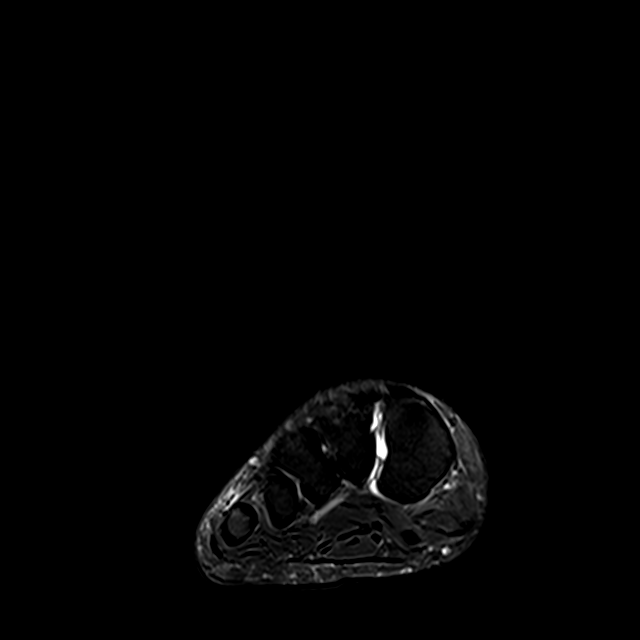
[im 18/35]
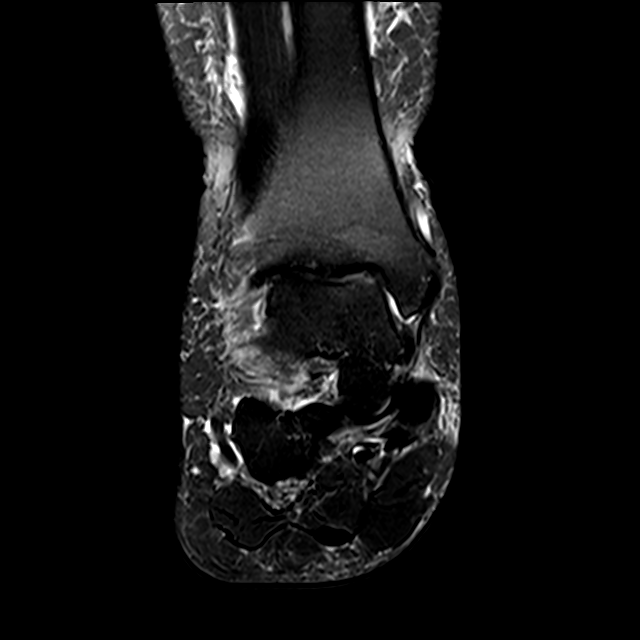
[im 31/35]
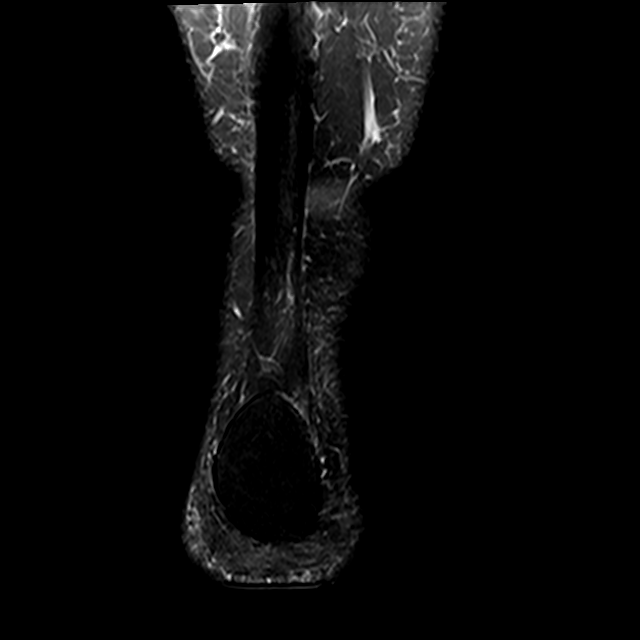

[12 of 40 positions shown; findings below may reference images not displayed]

FINDINGS: TENDONS

Peroneal: Peroneal longus tendon intact. Flattening and possible
split tear of the peroneus brevis. Trace amount of fluid along the
peroneal tendon concerning for tenosynovitis.

Posteromedial: Posterior tibial tendon intact. Flexor hallucis
longus tendon intact. Flexor digitorum longus tendon intact.

Anterior: Tibialis anterior tendon intact. Extensor hallucis longus
tendon intact Extensor digitorum longus tendon intact.

Achilles:  Intact.

Plantar Fascia: Intact.

LIGAMENTS

Lateral: Nonvisualization of the anterior talofibular ligament with
edema about the expected location suggesting subacute full-thickness
tear. Calcaneofibular ligament intact. Posterior talofibular
ligament intact. Anterior and posterior tibiofibular ligaments
intact.

Medial: Deltoid ligament intact. Spring ligament intact.

CARTILAGE

Ankle Joint: No joint effusion. Normal ankle mortise. No chondral
defect.

Subtalar Joints/Sinus Tarsi: Normal subtalar joints. No subtalar
joint effusion. Normal sinus tarsi.

Bones: No marrow signal abnormality.  No fracture or dislocation.

Soft Tissue: Ganglion cyst between the flexor digitorum and flexor
hallucis longus above the ankle joint measuring approximately 0.7 x
1.0 x 1.7 cm. Muscles are normal without edema or atrophy. Tarsal
tunnel is normal.
IMPRESSION: 1.  No evidence of fracture or dislocation.

2. No significant joint effusion or evidence of osteochondral
injury.

3. Nonvisualization of the anterior talofibular ligament with edema
about the expected location suggesting subacute full-thickness tear.

4. Split tear of the peroneus brevis and tenosynovitis of the
peroneus longus/brevis.

5. Ganglion cyst between the flexor digitorum and flexor hallucis
longus at above the level of the ankle joint measuring approximately
0.7 x 1.0 x 1.7 cm.

6. Mild generalized subcutaneous soft tissue edema of the ankle and
distal leg.
